# Patient Record
Sex: Female | Born: 1965 | Race: White | Hispanic: Yes | Smoking: Former smoker
Health system: Southern US, Community
[De-identification: ages and names within clinical notes are randomized; demographics above are authoritative.]

## PROBLEM LIST (undated history)

## (undated) DIAGNOSIS — C539 Malignant neoplasm of cervix uteri, unspecified: Secondary | ICD-10-CM

## (undated) DIAGNOSIS — G9050 Complex regional pain syndrome I, unspecified: Secondary | ICD-10-CM

## (undated) DIAGNOSIS — Z87891 Personal history of nicotine dependence: Secondary | ICD-10-CM

## (undated) DIAGNOSIS — M797 Fibromyalgia: Secondary | ICD-10-CM

## (undated) DIAGNOSIS — T18198A Other foreign object in esophagus causing other injury, initial encounter: Secondary | ICD-10-CM

## (undated) DIAGNOSIS — Z9689 Presence of other specified functional implants: Secondary | ICD-10-CM

## (undated) DIAGNOSIS — Z5189 Encounter for other specified aftercare: Secondary | ICD-10-CM

## (undated) DIAGNOSIS — K219 Gastro-esophageal reflux disease without esophagitis: Secondary | ICD-10-CM

## (undated) DIAGNOSIS — F32A Depression, unspecified: Secondary | ICD-10-CM

## (undated) DIAGNOSIS — Z4542 Encounter for adjustment and management of neuropacemaker (brain) (peripheral nerve) (spinal cord): Secondary | ICD-10-CM

## (undated) DIAGNOSIS — I4891 Unspecified atrial fibrillation: Secondary | ICD-10-CM

## (undated) DIAGNOSIS — G8929 Other chronic pain: Secondary | ICD-10-CM

## (undated) DIAGNOSIS — Z862 Personal history of diseases of the blood and blood-forming organs and certain disorders involving the immune mechanism: Secondary | ICD-10-CM

## (undated) DIAGNOSIS — K2270 Barrett's esophagus without dysplasia: Secondary | ICD-10-CM

## (undated) DIAGNOSIS — C801 Malignant (primary) neoplasm, unspecified: Secondary | ICD-10-CM

## (undated) HISTORY — DX: Depression, unspecified: F32.A

## (undated) HISTORY — PX: KNEE ARTHROSCOPY: SHX127

## (undated) HISTORY — PX: SPINAL CORD STIMULATOR IMPLANT: SHX2422

## (undated) HISTORY — PX: ULNAR NERVE TRANSPOSITION: SHX2595

## (undated) HISTORY — PX: CHOLECYSTECTOMY: SHX55

## (undated) HISTORY — PX: GANGLION CYST EXCISION: SHX1691

## (undated) HISTORY — PX: HYSTERECTOMY ABDOMINAL WITH SALPINGO-OOPHORECTOMY: SHX6792

## (undated) HISTORY — DX: Other chronic pain: G89.29

## (undated) HISTORY — PX: HIATAL HERNIA REPAIR: SHX195

## (undated) HISTORY — PX: TONSILLECTOMY: SUR1361

## (undated) HISTORY — DX: Unspecified atrial fibrillation: I48.91

## (undated) HISTORY — DX: Other foreign object in esophagus causing other injury, initial encounter: T18.198A

## (undated) HISTORY — DX: Fibromyalgia: M79.7

## (undated) HISTORY — DX: Complex regional pain syndrome I, unspecified: G90.50

## (undated) HISTORY — PX: BACK SURGERY: SHX140

## (undated) HISTORY — DX: Barrett's esophagus without dysplasia: K22.70

## (undated) HISTORY — PX: TRANSORAL INCISIONLESS FUNDOPLICATION: SHX6840

---

## 2009-11-24 DIAGNOSIS — G9050 Complex regional pain syndrome I, unspecified: Secondary | ICD-10-CM

## 2009-11-24 HISTORY — PX: SPINAL CORD STIMULATOR IMPLANT: SHX2422

## 2009-11-24 HISTORY — DX: Complex regional pain syndrome I, unspecified: G90.50

## 2017-11-24 HISTORY — PX: OTHER SURGICAL HISTORY: SHX169

## 2017-12-16 LAB — CBC AND DIFFERENTIAL
HCT: 43 (ref 36–46)
Hemoglobin: 14.5 (ref 12.0–16.0)
Neutrophils Absolute: 4060
Platelets: 213 (ref 150–399)
WBC: 7

## 2017-12-16 LAB — BASIC METABOLIC PANEL
BUN: 8 (ref 4–21)
CO2: 28 — AB (ref 13–22)
Chloride: 103 (ref 99–108)
Creatinine: 1 (ref 0.5–1.1)
Glucose: 103
Potassium: 4.9 (ref 3.4–5.3)
Sodium: 138 (ref 137–147)

## 2017-12-16 LAB — CBC: RBC: 5.4 — AB (ref 3.87–5.11)

## 2017-12-16 LAB — HEPATIC FUNCTION PANEL
Alkaline Phosphatase: 62 (ref 25–125)
Bilirubin, Total: 0.5

## 2017-12-16 LAB — COMPREHENSIVE METABOLIC PANEL
Albumin: 4.4 (ref 3.5–5.0)
Calcium: 9.7 (ref 8.7–10.7)
GFR calc Af Amer: 77
GFR calc non Af Amer: 67
Globulin: 3.1

## 2017-12-16 LAB — HM MAMMOGRAPHY

## 2017-12-21 DIAGNOSIS — Z79891 Long term (current) use of opiate analgesic: Secondary | ICD-10-CM | POA: Diagnosis not present

## 2017-12-21 DIAGNOSIS — Z5181 Encounter for therapeutic drug level monitoring: Secondary | ICD-10-CM | POA: Diagnosis not present

## 2017-12-21 DIAGNOSIS — M797 Fibromyalgia: Secondary | ICD-10-CM | POA: Diagnosis not present

## 2017-12-21 DIAGNOSIS — M25561 Pain in right knee: Secondary | ICD-10-CM | POA: Diagnosis not present

## 2017-12-21 DIAGNOSIS — M545 Low back pain: Secondary | ICD-10-CM | POA: Diagnosis not present

## 2017-12-21 DIAGNOSIS — M25562 Pain in left knee: Secondary | ICD-10-CM | POA: Diagnosis not present

## 2018-03-16 LAB — VITAMIN D 25 HYDROXY (VIT D DEFICIENCY, FRACTURES): Vit D, 25-Hydroxy: 30

## 2018-06-02 DIAGNOSIS — M797 Fibromyalgia: Secondary | ICD-10-CM | POA: Diagnosis not present

## 2018-06-02 DIAGNOSIS — Z79891 Long term (current) use of opiate analgesic: Secondary | ICD-10-CM | POA: Diagnosis not present

## 2018-06-02 DIAGNOSIS — Z5181 Encounter for therapeutic drug level monitoring: Secondary | ICD-10-CM | POA: Diagnosis not present

## 2018-06-02 DIAGNOSIS — M25561 Pain in right knee: Secondary | ICD-10-CM | POA: Diagnosis not present

## 2018-06-02 DIAGNOSIS — M545 Low back pain: Secondary | ICD-10-CM | POA: Diagnosis not present

## 2018-06-02 DIAGNOSIS — M25562 Pain in left knee: Secondary | ICD-10-CM | POA: Diagnosis not present

## 2018-07-02 DIAGNOSIS — M797 Fibromyalgia: Secondary | ICD-10-CM | POA: Diagnosis not present

## 2018-07-02 DIAGNOSIS — Z79891 Long term (current) use of opiate analgesic: Secondary | ICD-10-CM | POA: Diagnosis not present

## 2018-07-02 DIAGNOSIS — M545 Low back pain: Secondary | ICD-10-CM | POA: Diagnosis not present

## 2018-07-02 DIAGNOSIS — Z5181 Encounter for therapeutic drug level monitoring: Secondary | ICD-10-CM | POA: Diagnosis not present

## 2018-07-02 DIAGNOSIS — M25561 Pain in right knee: Secondary | ICD-10-CM | POA: Diagnosis not present

## 2018-07-02 DIAGNOSIS — M25562 Pain in left knee: Secondary | ICD-10-CM | POA: Diagnosis not present

## 2018-08-02 DIAGNOSIS — M25561 Pain in right knee: Secondary | ICD-10-CM | POA: Diagnosis not present

## 2018-08-02 DIAGNOSIS — Z79891 Long term (current) use of opiate analgesic: Secondary | ICD-10-CM | POA: Diagnosis not present

## 2018-08-02 DIAGNOSIS — M25562 Pain in left knee: Secondary | ICD-10-CM | POA: Diagnosis not present

## 2018-08-02 DIAGNOSIS — Z5181 Encounter for therapeutic drug level monitoring: Secondary | ICD-10-CM | POA: Diagnosis not present

## 2018-08-02 DIAGNOSIS — M797 Fibromyalgia: Secondary | ICD-10-CM | POA: Diagnosis not present

## 2018-08-02 DIAGNOSIS — M545 Low back pain: Secondary | ICD-10-CM | POA: Diagnosis not present

## 2018-08-11 DIAGNOSIS — I4892 Unspecified atrial flutter: Secondary | ICD-10-CM | POA: Diagnosis not present

## 2018-08-11 DIAGNOSIS — I4891 Unspecified atrial fibrillation: Secondary | ICD-10-CM | POA: Diagnosis not present

## 2018-09-01 DIAGNOSIS — M545 Low back pain: Secondary | ICD-10-CM | POA: Diagnosis not present

## 2018-09-01 DIAGNOSIS — M25561 Pain in right knee: Secondary | ICD-10-CM | POA: Diagnosis not present

## 2018-09-01 DIAGNOSIS — M797 Fibromyalgia: Secondary | ICD-10-CM | POA: Diagnosis not present

## 2018-09-01 DIAGNOSIS — M25562 Pain in left knee: Secondary | ICD-10-CM | POA: Diagnosis not present

## 2018-10-01 DIAGNOSIS — M545 Low back pain: Secondary | ICD-10-CM | POA: Diagnosis not present

## 2018-10-01 DIAGNOSIS — M25561 Pain in right knee: Secondary | ICD-10-CM | POA: Diagnosis not present

## 2018-10-01 DIAGNOSIS — Z5181 Encounter for therapeutic drug level monitoring: Secondary | ICD-10-CM | POA: Diagnosis not present

## 2018-10-01 DIAGNOSIS — M25562 Pain in left knee: Secondary | ICD-10-CM | POA: Diagnosis not present

## 2018-10-01 DIAGNOSIS — Z79891 Long term (current) use of opiate analgesic: Secondary | ICD-10-CM | POA: Diagnosis not present

## 2018-10-01 DIAGNOSIS — M797 Fibromyalgia: Secondary | ICD-10-CM | POA: Diagnosis not present

## 2018-10-25 DIAGNOSIS — T85192A Other mechanical complication of implanted electronic neurostimulator (electrode) of spinal cord, initial encounter: Secondary | ICD-10-CM | POA: Diagnosis not present

## 2018-11-01 DIAGNOSIS — Z5181 Encounter for therapeutic drug level monitoring: Secondary | ICD-10-CM | POA: Diagnosis not present

## 2018-11-01 DIAGNOSIS — M797 Fibromyalgia: Secondary | ICD-10-CM | POA: Diagnosis not present

## 2018-11-01 DIAGNOSIS — M545 Low back pain: Secondary | ICD-10-CM | POA: Diagnosis not present

## 2018-11-01 DIAGNOSIS — M25562 Pain in left knee: Secondary | ICD-10-CM | POA: Diagnosis not present

## 2018-11-01 DIAGNOSIS — M25561 Pain in right knee: Secondary | ICD-10-CM | POA: Diagnosis not present

## 2018-11-01 DIAGNOSIS — G8929 Other chronic pain: Secondary | ICD-10-CM | POA: Diagnosis not present

## 2018-11-02 DIAGNOSIS — I480 Paroxysmal atrial fibrillation: Secondary | ICD-10-CM | POA: Diagnosis not present

## 2018-11-02 DIAGNOSIS — Z01810 Encounter for preprocedural cardiovascular examination: Secondary | ICD-10-CM | POA: Diagnosis not present

## 2018-11-04 DIAGNOSIS — Z01818 Encounter for other preprocedural examination: Secondary | ICD-10-CM | POA: Diagnosis not present

## 2018-11-04 DIAGNOSIS — T85192A Other mechanical complication of implanted electronic neurostimulator (electrode) of spinal cord, initial encounter: Secondary | ICD-10-CM | POA: Diagnosis not present

## 2018-11-11 DIAGNOSIS — T85192A Other mechanical complication of implanted electronic neurostimulator (electrode) of spinal cord, initial encounter: Secondary | ICD-10-CM | POA: Insufficient documentation

## 2018-11-11 DIAGNOSIS — Z462 Encounter for fitting and adjustment of other devices related to nervous system and special senses: Secondary | ICD-10-CM | POA: Diagnosis not present

## 2018-11-30 DIAGNOSIS — M25561 Pain in right knee: Secondary | ICD-10-CM | POA: Diagnosis not present

## 2018-11-30 DIAGNOSIS — M797 Fibromyalgia: Secondary | ICD-10-CM | POA: Diagnosis not present

## 2018-11-30 DIAGNOSIS — G8929 Other chronic pain: Secondary | ICD-10-CM | POA: Diagnosis not present

## 2018-11-30 DIAGNOSIS — M25562 Pain in left knee: Secondary | ICD-10-CM | POA: Diagnosis not present

## 2018-11-30 DIAGNOSIS — E669 Obesity, unspecified: Secondary | ICD-10-CM | POA: Diagnosis not present

## 2018-11-30 DIAGNOSIS — M545 Low back pain: Secondary | ICD-10-CM | POA: Diagnosis not present

## 2018-11-30 DIAGNOSIS — Z5181 Encounter for therapeutic drug level monitoring: Secondary | ICD-10-CM | POA: Diagnosis not present

## 2018-12-31 DIAGNOSIS — M545 Low back pain: Secondary | ICD-10-CM | POA: Diagnosis not present

## 2018-12-31 DIAGNOSIS — Z5181 Encounter for therapeutic drug level monitoring: Secondary | ICD-10-CM | POA: Diagnosis not present

## 2018-12-31 DIAGNOSIS — M797 Fibromyalgia: Secondary | ICD-10-CM | POA: Diagnosis not present

## 2018-12-31 DIAGNOSIS — G8929 Other chronic pain: Secondary | ICD-10-CM | POA: Diagnosis not present

## 2018-12-31 DIAGNOSIS — M25561 Pain in right knee: Secondary | ICD-10-CM | POA: Diagnosis not present

## 2018-12-31 DIAGNOSIS — M25562 Pain in left knee: Secondary | ICD-10-CM | POA: Diagnosis not present

## 2019-01-31 DIAGNOSIS — M25561 Pain in right knee: Secondary | ICD-10-CM | POA: Diagnosis not present

## 2019-01-31 DIAGNOSIS — G8929 Other chronic pain: Secondary | ICD-10-CM | POA: Diagnosis not present

## 2019-01-31 DIAGNOSIS — M25562 Pain in left knee: Secondary | ICD-10-CM | POA: Diagnosis not present

## 2019-01-31 DIAGNOSIS — Z79891 Long term (current) use of opiate analgesic: Secondary | ICD-10-CM | POA: Diagnosis not present

## 2019-01-31 DIAGNOSIS — M545 Low back pain: Secondary | ICD-10-CM | POA: Diagnosis not present

## 2019-01-31 DIAGNOSIS — M797 Fibromyalgia: Secondary | ICD-10-CM | POA: Diagnosis not present

## 2019-01-31 DIAGNOSIS — Z5181 Encounter for therapeutic drug level monitoring: Secondary | ICD-10-CM | POA: Diagnosis not present

## 2019-02-01 DIAGNOSIS — G9050 Complex regional pain syndrome I, unspecified: Secondary | ICD-10-CM | POA: Diagnosis not present

## 2019-02-01 DIAGNOSIS — I480 Paroxysmal atrial fibrillation: Secondary | ICD-10-CM | POA: Diagnosis not present

## 2019-03-02 DIAGNOSIS — G8929 Other chronic pain: Secondary | ICD-10-CM | POA: Diagnosis not present

## 2019-03-02 DIAGNOSIS — M545 Low back pain: Secondary | ICD-10-CM | POA: Diagnosis not present

## 2019-03-02 DIAGNOSIS — M797 Fibromyalgia: Secondary | ICD-10-CM | POA: Diagnosis not present

## 2019-03-02 DIAGNOSIS — Z5181 Encounter for therapeutic drug level monitoring: Secondary | ICD-10-CM | POA: Diagnosis not present

## 2019-03-02 DIAGNOSIS — M25561 Pain in right knee: Secondary | ICD-10-CM | POA: Diagnosis not present

## 2019-03-02 DIAGNOSIS — M25562 Pain in left knee: Secondary | ICD-10-CM | POA: Diagnosis not present

## 2019-04-01 DIAGNOSIS — Z5181 Encounter for therapeutic drug level monitoring: Secondary | ICD-10-CM | POA: Diagnosis not present

## 2019-04-01 DIAGNOSIS — M25562 Pain in left knee: Secondary | ICD-10-CM | POA: Diagnosis not present

## 2019-04-01 DIAGNOSIS — M797 Fibromyalgia: Secondary | ICD-10-CM | POA: Diagnosis not present

## 2019-04-01 DIAGNOSIS — G8929 Other chronic pain: Secondary | ICD-10-CM | POA: Diagnosis not present

## 2019-04-01 DIAGNOSIS — M545 Low back pain: Secondary | ICD-10-CM | POA: Diagnosis not present

## 2019-04-01 DIAGNOSIS — M25561 Pain in right knee: Secondary | ICD-10-CM | POA: Diagnosis not present

## 2019-04-26 DIAGNOSIS — Z03818 Encounter for observation for suspected exposure to other biological agents ruled out: Secondary | ICD-10-CM | POA: Diagnosis not present

## 2019-04-26 DIAGNOSIS — B349 Viral infection, unspecified: Secondary | ICD-10-CM | POA: Diagnosis not present

## 2019-04-26 DIAGNOSIS — J029 Acute pharyngitis, unspecified: Secondary | ICD-10-CM | POA: Diagnosis not present

## 2019-05-16 DIAGNOSIS — F438 Other reactions to severe stress: Secondary | ICD-10-CM | POA: Diagnosis not present

## 2019-06-01 DIAGNOSIS — M25561 Pain in right knee: Secondary | ICD-10-CM | POA: Diagnosis not present

## 2019-06-01 DIAGNOSIS — Z5181 Encounter for therapeutic drug level monitoring: Secondary | ICD-10-CM | POA: Diagnosis not present

## 2019-06-01 DIAGNOSIS — G8929 Other chronic pain: Secondary | ICD-10-CM | POA: Diagnosis not present

## 2019-06-01 DIAGNOSIS — M797 Fibromyalgia: Secondary | ICD-10-CM | POA: Diagnosis not present

## 2019-06-01 DIAGNOSIS — M25562 Pain in left knee: Secondary | ICD-10-CM | POA: Diagnosis not present

## 2019-06-01 DIAGNOSIS — M545 Low back pain: Secondary | ICD-10-CM | POA: Diagnosis not present

## 2019-06-14 DIAGNOSIS — F438 Other reactions to severe stress: Secondary | ICD-10-CM | POA: Diagnosis not present

## 2019-06-30 DIAGNOSIS — F438 Other reactions to severe stress: Secondary | ICD-10-CM | POA: Diagnosis not present

## 2019-07-01 DIAGNOSIS — G8929 Other chronic pain: Secondary | ICD-10-CM | POA: Diagnosis not present

## 2019-07-01 DIAGNOSIS — M25562 Pain in left knee: Secondary | ICD-10-CM | POA: Diagnosis not present

## 2019-07-01 DIAGNOSIS — M25561 Pain in right knee: Secondary | ICD-10-CM | POA: Diagnosis not present

## 2019-07-01 DIAGNOSIS — Z5181 Encounter for therapeutic drug level monitoring: Secondary | ICD-10-CM | POA: Diagnosis not present

## 2019-07-01 DIAGNOSIS — M545 Low back pain: Secondary | ICD-10-CM | POA: Diagnosis not present

## 2019-07-01 DIAGNOSIS — M797 Fibromyalgia: Secondary | ICD-10-CM | POA: Diagnosis not present

## 2019-08-03 DIAGNOSIS — M797 Fibromyalgia: Secondary | ICD-10-CM | POA: Diagnosis not present

## 2019-08-03 DIAGNOSIS — M545 Low back pain: Secondary | ICD-10-CM | POA: Diagnosis not present

## 2019-08-03 DIAGNOSIS — G8929 Other chronic pain: Secondary | ICD-10-CM | POA: Diagnosis not present

## 2019-08-03 DIAGNOSIS — M25561 Pain in right knee: Secondary | ICD-10-CM | POA: Diagnosis not present

## 2019-08-03 DIAGNOSIS — Z79891 Long term (current) use of opiate analgesic: Secondary | ICD-10-CM | POA: Diagnosis not present

## 2019-08-03 DIAGNOSIS — Z5181 Encounter for therapeutic drug level monitoring: Secondary | ICD-10-CM | POA: Diagnosis not present

## 2019-08-03 DIAGNOSIS — M25562 Pain in left knee: Secondary | ICD-10-CM | POA: Diagnosis not present

## 2019-08-08 DIAGNOSIS — F438 Other reactions to severe stress: Secondary | ICD-10-CM | POA: Diagnosis not present

## 2019-08-22 ENCOUNTER — Other Ambulatory Visit: Payer: Self-pay

## 2019-08-22 ENCOUNTER — Emergency Department: Payer: Medicaid Other

## 2019-08-22 ENCOUNTER — Emergency Department
Admission: EM | Admit: 2019-08-22 | Discharge: 2019-08-22 | Disposition: A | Discharge status: Home / Self Care | Payer: Medicaid Other | Attending: Emergency Medicine | Admitting: Emergency Medicine

## 2019-08-22 DIAGNOSIS — R0789 Other chest pain: Secondary | ICD-10-CM | POA: Diagnosis not present

## 2019-08-22 DIAGNOSIS — R079 Chest pain, unspecified: Secondary | ICD-10-CM | POA: Diagnosis not present

## 2019-08-22 DIAGNOSIS — Z8541 Personal history of malignant neoplasm of cervix uteri: Secondary | ICD-10-CM | POA: Insufficient documentation

## 2019-08-22 DIAGNOSIS — Z87891 Personal history of nicotine dependence: Secondary | ICD-10-CM | POA: Insufficient documentation

## 2019-08-22 DIAGNOSIS — R000 Tachycardia, unspecified: Secondary | ICD-10-CM | POA: Diagnosis not present

## 2019-08-22 HISTORY — DX: Malignant (primary) neoplasm, unspecified: C80.1

## 2019-08-22 HISTORY — DX: Encounter for other specified aftercare: Z51.89

## 2019-08-22 LAB — BASIC METABOLIC PANEL
Anion gap: 8 (ref 5–15)
BUN: 10 mg/dL (ref 6–20)
CO2: 26 mmol/L (ref 22–32)
Calcium: 8.6 mg/dL — ABNORMAL LOW (ref 8.9–10.3)
Chloride: 103 mmol/L (ref 98–111)
Creatinine, Ser: 0.9 mg/dL (ref 0.44–1.00)
GFR calc Af Amer: >60 mL/min (ref >60)
GFR calc non Af Amer: >60 mL/min (ref >60)
Glucose, Bld: 109 mg/dL — ABNORMAL HIGH (ref 70–99)
Potassium: 4.3 mmol/L (ref 3.5–5.1)
Sodium: 137 mmol/L (ref 135–145)

## 2019-08-22 LAB — CBC WITH DIFFERENTIAL/PLATELET
Abs Immature Granulocytes: 0.03 10*3/uL (ref 0.00–0.07)
Basophils Absolute: 0.1 10*3/uL (ref 0.0–0.1)
Basophils Relative: 1 %
Eosinophils Absolute: 0.1 10*3/uL (ref 0.0–0.5)
Eosinophils Relative: 1 %
HCT: 39.5 % (ref 36.0–46.0)
Hemoglobin: 12.6 g/dL (ref 12.0–15.0)
Immature Granulocytes: 0 %
Lymphocytes Relative: 23 %
Lymphs Abs: 2.1 10*3/uL (ref 0.7–4.0)
MCH: 26.8 pg (ref 26.0–34.0)
MCHC: 31.9 g/dL (ref 30.0–36.0)
MCV: 83.9 fL (ref 80.0–100.0)
Monocytes Absolute: 0.6 10*3/uL (ref 0.1–1.0)
Monocytes Relative: 7 %
Neutro Abs: 6 10*3/uL (ref 1.7–7.7)
Neutrophils Relative %: 68 %
Platelets: 156 10*3/uL (ref 150–400)
RBC: 4.71 MIL/uL (ref 3.87–5.11)
RDW: 13.5 % (ref 11.5–15.5)
WBC: 8.8 10*3/uL (ref 4.0–10.5)
nRBC: 0 % (ref 0.0–0.2)

## 2019-08-22 LAB — TROPONIN I (HIGH SENSITIVITY)
Troponin I (High Sensitivity): <2 ng/L (ref <18)
Troponin I (High Sensitivity): <2 ng/L (ref <18)

## 2019-08-22 MED ORDER — MORPHINE SULFATE (PF) 4 MG/ML IV SOLN
4.0000 mg | Freq: Once | INTRAVENOUS | Status: AC
  Administered 2019-08-22: 02:00:00 4 mg via INTRAVENOUS
  Filled 2019-08-22: qty 1

## 2019-08-22 NOTE — ED Triage Notes (Signed)
Patient arrived by [facility] from home with complaints of chest pain that started this evening. Per patient states feels like contractions in middle of chest. BP WDL. Patient ST at 115. EMS gave 324 mg Asprin, I nitro spray.

## 2019-08-22 NOTE — ED Provider Notes (Signed)
[facility] Emergency Department Provider Note   ____________________________________________   First MD Initiated Contact with Patient 08/22/19 0206     (approximate)  I have reviewed the triage vital signs and the nursing notes.   HISTORY  Chief Complaint Chest Pain   HPI Julie Berger is a 53 y.o. female with past medical history of fibromyalgia and atrial fibrillation presents to the ED complaining of chest pain.  Patient reports that she has been dealing with a couple weeks of intermittent pain in the center of her chest.  She describes it as feeling like a squeezing, which can come on at any time.  It is not exacerbated by exertion or a deep breath and became particularly severe while she was sitting still at 8 PM this evening.  She states it is come in waves since then, was mild upon arrival to the ED but ramped up during course of interview.  She denies any associated fevers, cough, shortness of breath, nausea, vomiting, or abdominal pain.  She states she has never had similar symptoms in the past, denies any cardiac history beyond atrial fibrillation.  She states she is not on anticoagulation for her atrial fibrillation because her cardiologist told her "it was due to stress".        Past Medical History:  Diagnosis Date  . Blood transfusion without reported diagnosis   . Cancer ([place])    cervical    There are no active problems to display for this patient.   Past Surgical History:  Procedure Laterality Date  . ABDOMINAL HYSTERECTOMY    . BACK SURGERY     x 3  . CHOLECYSTECTOMY    . HERNIA REPAIR    . KNEE ARTHROSCOPY    . TIF    . TONSILLECTOMY      Prior to Admission medications   Not on File    Allergies Nsaids  Family History  Problem Relation Age of Onset  . Heart attack Father   . Mitral valve prolapse Father   . Hypertension Father     Social History Social History   Tobacco Use  . Smoking status: Former Smoker     Packs/day: 1.00    Years: 20.00    Pack years: 20.00    Types: Cigarettes    Quit date: 08/22/1999    Years since quitting: 20.0  . Smokeless tobacco: Never Used  Substance Use Topics  . Alcohol use: Never    Frequency: Never  . Drug use: Never    Review of Systems  Constitutional: No fever/chills Eyes: No visual changes. ENT: No sore throat. Cardiovascular: Positive for chest pain. Respiratory: Denies shortness of breath. Gastrointestinal: No abdominal pain.  No nausea, no vomiting.  No diarrhea.  No constipation. Genitourinary: Negative for dysuria. Musculoskeletal: Negative for back pain. Skin: Negative for rash. Neurological: Negative for headaches, focal weakness or numbness.  ____________________________________________   PHYSICAL EXAM:  VITAL SIGNS: ED Triage Vitals  Enc Vitals Group     BP      Pulse      Resp      Temp      Temp src      SpO2      Weight      Height      Head Circumference      Peak Flow      Pain Score      Pain Loc      Pain Edu?      Excl.  in [place]?     Constitutional: Alert and oriented. Eyes: Conjunctivae are normal. Head: Atraumatic. Nose: No congestion/rhinnorhea. Mouth/Throat: Mucous membranes are moist. Neck: Normal ROM Cardiovascular: Normal rate, regular rhythm. Grossly normal heart sounds. Respiratory: Normal respiratory effort.  No retractions. Lungs CTAB. Gastrointestinal: Soft and nontender. No distention. Genitourinary: deferred Musculoskeletal: No lower extremity tenderness nor edema. Neurologic:  Normal speech and language. No gross focal neurologic deficits are appreciated. Skin:  Skin is warm, dry and intact. No rash noted. Psychiatric: Mood and affect are normal. Speech and behavior are normal.  ____________________________________________   LABS (all labs ordered are listed, but only abnormal results are displayed)  Labs Reviewed  BASIC METABOLIC PANEL - Abnormal; Notable for the following  components:      Result Value   Glucose, Bld 109 (*)    Calcium 8.6 (*)    All other components within normal limits  CBC WITH DIFFERENTIAL/PLATELET  TROPONIN I (HIGH SENSITIVITY)  TROPONIN I (HIGH SENSITIVITY)   ____________________________________________  EKG  ED ECG REPORT I, Blake Divine, the attending physician, personally viewed and interpreted this ECG.   Date: 08/22/2019  EKG Time: 2:09  Rate: 100  Rhythm: sinus tachycardia  Axis: Normal  Intervals:none  ST&T Change: None    PROCEDURES  Procedure(s) performed (including Critical Care):  Procedures   ____________________________________________   INITIAL IMPRESSION / ASSESSMENT AND PLAN / ED COURSE       53 year old female presents to the ED with intermittent pain in her chest over the past 2 weeks which is not associated with exertion.  EKG shows sinus rhythm without any ischemic changes, low suspicion for ACS given atypical symptoms.  Will check 2 sets of troponin, CBC, BMP, chest x-ray.  Musculoskeletal etiology of pain appears more likely.  Do not suspect PE as she is low risk by [place], no signs or symptoms of DVT.  2 sets of troponin within normal limits.  Patient reports chest pain is now resolved.  Chest x-ray negative for acute process.  Do not suspect ACS given heart score of less than 4 in 2 sets of negative troponin.  Patient appropriate for discharge home with PCP follow-up, patient agrees with plan.  Counseled her to return to the ED for new or worsening symptoms.      ____________________________________________   FINAL CLINICAL IMPRESSION(S) / ED DIAGNOSES  Final diagnoses:  Nonspecific chest pain     ED Discharge Orders    None       Note:  This document was prepared using Dragon voice recognition software and may include unintentional dictation errors.   Blake Divine, MD 08/22/19 [phone]

## 2019-08-22 NOTE — ED Notes (Addendum)
Wrong chart

## 2019-09-02 DIAGNOSIS — Z5181 Encounter for therapeutic drug level monitoring: Secondary | ICD-10-CM | POA: Diagnosis not present

## 2019-09-02 DIAGNOSIS — G8929 Other chronic pain: Secondary | ICD-10-CM | POA: Diagnosis not present

## 2019-09-02 DIAGNOSIS — M797 Fibromyalgia: Secondary | ICD-10-CM | POA: Diagnosis not present

## 2019-09-02 DIAGNOSIS — M25561 Pain in right knee: Secondary | ICD-10-CM | POA: Diagnosis not present

## 2019-09-02 DIAGNOSIS — M545 Low back pain: Secondary | ICD-10-CM | POA: Diagnosis not present

## 2019-09-02 DIAGNOSIS — M25562 Pain in left knee: Secondary | ICD-10-CM | POA: Diagnosis not present

## 2019-09-08 DIAGNOSIS — F438 Other reactions to severe stress: Secondary | ICD-10-CM | POA: Diagnosis not present

## 2019-09-21 DIAGNOSIS — F438 Other reactions to severe stress: Secondary | ICD-10-CM | POA: Diagnosis not present

## 2019-09-28 DIAGNOSIS — G8929 Other chronic pain: Secondary | ICD-10-CM | POA: Diagnosis not present

## 2019-09-28 DIAGNOSIS — M25562 Pain in left knee: Secondary | ICD-10-CM | POA: Diagnosis not present

## 2019-09-28 DIAGNOSIS — Z5181 Encounter for therapeutic drug level monitoring: Secondary | ICD-10-CM | POA: Diagnosis not present

## 2019-09-28 DIAGNOSIS — M797 Fibromyalgia: Secondary | ICD-10-CM | POA: Diagnosis not present

## 2019-09-28 DIAGNOSIS — M25561 Pain in right knee: Secondary | ICD-10-CM | POA: Diagnosis not present

## 2019-09-28 DIAGNOSIS — M545 Low back pain: Secondary | ICD-10-CM | POA: Diagnosis not present

## 2019-09-28 DIAGNOSIS — F438 Other reactions to severe stress: Secondary | ICD-10-CM | POA: Diagnosis not present

## 2019-10-12 DIAGNOSIS — F438 Other reactions to severe stress: Secondary | ICD-10-CM | POA: Diagnosis not present

## 2019-10-26 ENCOUNTER — Ambulatory Visit: Payer: Medicaid Other | Admitting: Adult Health

## 2019-10-26 DIAGNOSIS — F438 Other reactions to severe stress: Secondary | ICD-10-CM | POA: Diagnosis not present

## 2019-11-02 ENCOUNTER — Ambulatory Visit: Payer: Medicaid Other | Admitting: Family Medicine

## 2019-11-02 ENCOUNTER — Other Ambulatory Visit: Payer: Self-pay

## 2019-11-02 ENCOUNTER — Encounter: Payer: Self-pay | Admitting: Family Medicine

## 2019-11-02 VITALS — BP 115/80 | HR 88 | Temp 97.3°F | Wt 186.0 lb

## 2019-11-02 DIAGNOSIS — M25561 Pain in right knee: Secondary | ICD-10-CM | POA: Diagnosis not present

## 2019-11-02 DIAGNOSIS — E66811 Obesity, class 1: Secondary | ICD-10-CM

## 2019-11-02 DIAGNOSIS — F329 Major depressive disorder, single episode, unspecified: Secondary | ICD-10-CM | POA: Insufficient documentation

## 2019-11-02 DIAGNOSIS — F3341 Major depressive disorder, recurrent, in partial remission: Secondary | ICD-10-CM

## 2019-11-02 DIAGNOSIS — Z1211 Encounter for screening for malignant neoplasm of colon: Secondary | ICD-10-CM

## 2019-11-02 DIAGNOSIS — M545 Low back pain: Secondary | ICD-10-CM | POA: Diagnosis not present

## 2019-11-02 DIAGNOSIS — G9050 Complex regional pain syndrome I, unspecified: Secondary | ICD-10-CM | POA: Diagnosis not present

## 2019-11-02 DIAGNOSIS — G894 Chronic pain syndrome: Secondary | ICD-10-CM

## 2019-11-02 DIAGNOSIS — Z1231 Encounter for screening mammogram for malignant neoplasm of breast: Secondary | ICD-10-CM

## 2019-11-02 DIAGNOSIS — Z8541 Personal history of malignant neoplasm of cervix uteri: Secondary | ICD-10-CM

## 2019-11-02 DIAGNOSIS — M25562 Pain in left knee: Secondary | ICD-10-CM | POA: Diagnosis not present

## 2019-11-02 DIAGNOSIS — E669 Obesity, unspecified: Secondary | ICD-10-CM

## 2019-11-02 DIAGNOSIS — M797 Fibromyalgia: Secondary | ICD-10-CM | POA: Diagnosis not present

## 2019-11-02 DIAGNOSIS — Z79891 Long term (current) use of opiate analgesic: Secondary | ICD-10-CM | POA: Diagnosis not present

## 2019-11-02 DIAGNOSIS — Z560 Unemployment, unspecified: Secondary | ICD-10-CM

## 2019-11-02 DIAGNOSIS — Z6830 Body mass index (BMI) 30.0-30.9, adult: Secondary | ICD-10-CM

## 2019-11-02 DIAGNOSIS — Z5181 Encounter for therapeutic drug level monitoring: Secondary | ICD-10-CM | POA: Diagnosis not present

## 2019-11-02 DIAGNOSIS — G8929 Other chronic pain: Secondary | ICD-10-CM | POA: Diagnosis not present

## 2019-11-02 DIAGNOSIS — I480 Paroxysmal atrial fibrillation: Secondary | ICD-10-CM | POA: Diagnosis not present

## 2019-11-02 DIAGNOSIS — K2100 Gastro-esophageal reflux disease with esophagitis, without bleeding: Secondary | ICD-10-CM | POA: Diagnosis not present

## 2019-11-02 NOTE — Assessment & Plan Note (Signed)
Discussed importance of healthy weight management Discussed diet and exercise  

## 2019-11-02 NOTE — Assessment & Plan Note (Signed)
As above, followed by pain management Continue Lyrica

## 2019-11-02 NOTE — Assessment & Plan Note (Addendum)
Chronic pain related to multiple back surgeries, fibromyalgia and CRPS SCS in place Followed by pain management - will refer to local pain management No changes to medications

## 2019-11-02 NOTE — Assessment & Plan Note (Signed)
SCS in place Followed by pain management - will refer to local pain management

## 2019-11-02 NOTE — Assessment & Plan Note (Signed)
Currently in NSR Followed by Cardiology Not on any anticoagulation Continue Diltiazem for rate control

## 2019-11-02 NOTE — Progress Notes (Signed)
Patient: Julie Berger, Female    DOB: 1966/06/03, 53 y.o.   MRN: [id number] Visit Date: 11/02/2019  Today's Provider: Lavon Paganini, MD   Chief Complaint  Patient presents with  . Establish Care   Subjective:     Establish Care Julie Berger is a 53 y.o. female who presents today to establish care. She feels well. She reports exercising some. She reports she is sleeping fairly well.  She moved here about 1.5 yrs ago, but was having surgery and kept all of her medical appointments in [place] for the time being. -----------------------------------------------------------------  Needs referral to local pain management.  Her current pain management is in [place].  Has a spinal cord stimulator and recently had it replaced in 2019 by [facility].  She has chronic back pain s/p 3 back surgeries.  She also suffers from fibromyalgia and Complex Regional Pain Syndrome. Takes Percocet 3-4 times daily.  Lyrica 168m TID.  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  She reports this is related to her fibromyalgia. She suffered from GERD (?[place]) and is s/p TIF and Nissen. She is not currently on any antacids or PPIs.  She would like referral to GI.  She has never had colon cancer screening and declines colonoscopy.  She will agree to [employer]  She cannot remember when she last had a mammogram and agrees to one being ordered.  She has a lArts administrator(Dr FUbaldo Glassing for her pAfib. She is taking Cardizem for rate control.  No anticoagulation.  [facility] (psychiatrist).  Taking Wellbutrin XL 1553mdaily (filled by Psychiatry). Reports good control of her depression symptoms  History of cervical cancer s/p hysterectomy in 2002.  She reports she was cleared after a few vaginal pap smears were negative.  Review of Systems  Constitutional: Positive for fatigue. Negative for activity change, appetite change, chills, diaphoresis, fever and  unexpected weight change.  HENT: Positive for dental problem and voice change. Negative for congestion, drooling, ear discharge, ear pain, facial swelling, hearing loss, mouth sores, nosebleeds, postnasal drip, rhinorrhea, sinus pressure, sinus pain, sneezing, sore throat, tinnitus and trouble swallowing.   Eyes: Positive for photophobia and itching. Negative for pain, discharge, redness and visual disturbance.  Respiratory: Negative.   Cardiovascular: Positive for chest pain, palpitations and leg swelling.  Gastrointestinal: Positive for constipation and diarrhea. Negative for abdominal distention, abdominal pain, anal bleeding, blood in stool, nausea, rectal pain and vomiting.  Endocrine: Negative.   Genitourinary: Negative.   Musculoskeletal: Positive for arthralgias, back pain, joint swelling, myalgias, neck pain and neck stiffness. Negative for gait problem.  Skin: Negative.   Allergic/Immunologic: Negative.   Neurological: Negative.   Hematological: Negative.   Psychiatric/Behavioral: Negative.     Social History      She  reports that she quit smoking about 20 years ago. Her smoking use included cigarettes. She has a 20.00 pack-year smoking history. She has never used smokeless tobacco. She reports that she does not drink alcohol or use drugs.       Social History   Socioeconomic History  . Marital status: Divorced    Spouse name: Not on file  . Number of children: 6  . Years of education: Not on file  . Highest education level: Not on file  Occupational History  . Occupation: Disabiled  Social Needs  . Financial resource strain: Not on file  . Food insecurity    Worry: Not on file    Inability:  Not on file  . Transportation needs    Medical: Not on file    Non-medical: Not on file  Tobacco Use  . Smoking status: Former Smoker    Packs/day: 1.00    Years: 20.00    Pack years: 20.00    Types: Cigarettes    Quit date: 08/22/1999    Years since quitting: 20.2  .  Smokeless tobacco: Never Used  Substance and Sexual Activity  . Alcohol use: Never    Frequency: Never  . Drug use: Never  . Sexual activity: Never  Lifestyle  . Physical activity    Days per week: Not on file    Minutes per session: Not on file  . Stress: Not on file  Relationships  . Social Herbalist on phone: Not on file    Gets together: Not on file    Attends religious service: Not on file    Active member of club or organization: Not on file    Attends meetings of clubs or organizations: Not on file    Relationship status: Not on file  Other Topics Concern  . Not on file  Social History Narrative  . Not on file    Past Medical History:  Diagnosis Date  . Blood transfusion without reported diagnosis   . Cancer ([facility])    cervical  . Chronic pain   . 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 Chronic pain 11/02/2019  . History of cervical cancer 11/02/2019  . Fibromyalgia 11/02/2019  . Obesity 11/02/2019  . Not currently working due to disabled status 11/02/2019  . MDD (major depressive disorder) 11/02/2019  . Complex regional pain syndrome I 11/02/2019  . Paroxysmal atrial fibrillation ([place]) 11/02/2019    Past Surgical History:  Procedure Laterality Date  . ABDOMINAL HYSTERECTOMY    . BACK SURGERY     x 3  . CHOLECYSTECTOMY    . HIATAL HERNIA REPAIR    . KNEE ARTHROSCOPY Left   . TONSILLECTOMY    . TRANSORAL INCISIONLESS FUNDOPLICATION    . ULNAR NERVE TRANSPOSITION Left     Family History        Family Status  Relation Name Status  . Father  Alive  . Mother  Alive  . Brother  (Not Specified)  . Neg Hx  (Not Specified)        Her family history includes Cancer in her brother; Dementia in her mother; Diabetes in her brother; Fibromyalgia in her mother; Heart attack in her father; Hypertension in her father; Lupus in her mother; Mitral valve prolapse in  her father. There is no history of Colon cancer or Breast cancer.      Allergies  Allergen Reactions  . Nsaids Shortness Of Breath     Current Outpatient Medications:  .  buPROPion (WELLBUTRIN XL) 150 MG 24 hr tablet, Take 150 mg by mouth daily., Disp: , Rfl:  .  diltiazem (CARDIZEM CD) 120 MG 24 hr capsule, Take by mouth., Disp: , Rfl:  .  naloxone (NARCAN) 4 MG/0.1ML LIQD nasal spray kit, USE UTD FOR SUSPECTED OVERDOSE., Disp: , Rfl:  .  oxyCODONE-acetaminophen (PERCOCET) 10-325 MG tablet, Take 1 tablet by mouth 4 (four) times daily as needed., Disp: , Rfl:  .  pregabalin (LYRICA) 150 MG capsule, Take 150 mg by mouth 3 (three) times daily., Disp: , Rfl:    Patient  Care Team: Virginia Crews, MD as PCP - General (Family Medicine)    Objective:    Vitals: BP 115/80 (BP Location: Right Arm, Patient Position: Sitting, Cuff Size: Large)   Pulse 88   Temp (!) 97.3 F (36.3 C) (Temporal)   Wt 186 lb (84.4 kg)   BMI 30.95 kg/m    Vitals:   11/02/19 0821  BP: 115/80  Pulse: 88  Temp: (!) 97.3 F (36.3 C)  TempSrc: Temporal  Weight: 186 lb (84.4 kg)     Physical Exam Vitals reviewed.  Constitutional:      General: She is not in acute distress.    Appearance: Normal appearance. She is well-developed. She is not diaphoretic.  HENT:     Head: Normocephalic and atraumatic.     Right Ear: Tympanic membrane, ear canal and external ear normal.     Left Ear: Tympanic membrane, ear canal and external ear normal.  Eyes:     General: No scleral icterus.    Conjunctiva/sclera: Conjunctivae normal.     Pupils: Pupils are equal, round, and reactive to light.  Neck:     Thyroid: No thyromegaly.  Cardiovascular:     Rate and Rhythm: Normal rate and regular rhythm.     Pulses: Normal pulses.     Heart sounds: Normal heart sounds. No murmur.  Pulmonary:     Effort: Pulmonary effort is normal. No respiratory distress.     Breath sounds: Normal breath sounds. No wheezing or  rales.  Abdominal:     General: There is no distension.     Palpations: Abdomen is soft.     Tenderness: There is no abdominal tenderness.  Musculoskeletal:        General: No deformity.     Cervical back: Neck supple.     Right lower leg: No edema.     Left lower leg: No edema.  Lymphadenopathy:     Cervical: No cervical adenopathy.  Skin:    General: Skin is warm and dry.     Capillary Refill: Capillary refill takes less than 2 seconds.     Findings: No rash.  Neurological:     Mental Status: She is alert and oriented to person, place, and time. Mental status is at baseline.  Psychiatric:        Mood and Affect: Mood normal.        Behavior: Behavior normal.        Thought Content: Thought content normal.      Depression Screen PHQ 2/9 Scores 11/02/2019  PHQ - 2 Score 3  PHQ- 9 Score 12       Assessment & Plan:     Establish care  Exercise Activities and Dietary recommendations Goals   None      There is no immunization history on file for this patient.  Health Maintenance  Topic Date Due  . Hepatitis C Screening  [birth year]  . HIV Screening  07/11/1979  . TETANUS/TDAP  07/11/1983  . MAMMOGRAM  07/10/2014  . COLONOSCOPY  07/10/2014  . INFLUENZA VACCINE  02/22/2020 (Originally 06/25/2019)     Discussed health benefits of physical activity, and encouraged her to engage in regular exercise appropriate for her age and condition.    --------------------------------------------------------------------  Problem List Items Addressed This Visit      Cardiovascular and Mediastinum   Paroxysmal atrial fibrillation ([place])    Currently in NSR Followed by Cardiology Not on any anticoagulation Continue Diltiazem for rate control  Relevant Medications   diltiazem (CARDIZEM CD) 120 MG 24 hr capsule     Digestive   Gastroesophageal reflux disease with esophagitis    Chronic and currently well controlled without any medications Surgery of possible  Barrett's esophagus and is status post TIF and Nissen Referral to GI per patient request      Relevant Orders   Ambulatory referral to Gastroenterology     Nervous and Auditory   Complex regional pain syndrome I    SCS in place Followed by pain management - will refer to local pain management      Relevant Medications   pregabalin (LYRICA) 150 MG capsule   oxyCODONE-acetaminophen (PERCOCET) 10-325 MG tablet   buPROPion (WELLBUTRIN XL) 150 MG 24 hr tablet     Other   Chronic pain - Primary    Chronic pain related to multiple back surgeries, fibromyalgia and CRPS SCS in place Followed by pain management - will refer to local pain management No changes to medications       Relevant Medications   pregabalin (LYRICA) 150 MG capsule   oxyCODONE-acetaminophen (PERCOCET) 10-325 MG tablet   buPROPion (WELLBUTRIN XL) 150 MG 24 hr tablet   Other Relevant Orders   Ambulatory referral to Pain Clinic   History of cervical cancer    S.p hysterectomy      Fibromyalgia    As above, followed by pain management Continue Lyrica      Relevant Medications   pregabalin (LYRICA) 150 MG capsule   oxyCODONE-acetaminophen (PERCOCET) 10-325 MG tablet   buPROPion (WELLBUTRIN XL) 150 MG 24 hr tablet   Other Relevant Orders   Ambulatory referral to Pain Clinic   Obesity    Discussed importance of healthy weight management Discussed diet and exercise       Not currently working due to disabled status    Patient on permanent disability related to her chronic pain syndrome      MDD (major depressive disorder)    Followed by psychiatry Well controlled Continue Wellbutrin Seeing a therapist      Relevant Medications   buPROPion (WELLBUTRIN XL) 150 MG 24 hr tablet    Other Visit Diagnoses    Encounter for screening mammogram for malignant neoplasm of breast       Relevant Orders   MM 3D SCREEN BREAST BILATERAL   Screen for colon cancer       Relevant Orders   Cologuard        Return in about 3 months (around 01/31/2020) for CPE.   Approximately 45 minutes was spent in discussion of which greater than 50% was consultation.     The entirety of the information documented in the History of Present Illness, Review of Systems and Physical Exam were personally obtained by me. Portions of this information were initially documented by Ashley Royalty, CMA and reviewed by me for thoroughness and accuracy.    Kaitrin Seybold, Dionne Bucy, MD MPH [facility]

## 2019-11-02 NOTE — Assessment & Plan Note (Signed)
Patient on permanent disability related to her chronic pain syndrome

## 2019-11-02 NOTE — Assessment & Plan Note (Signed)
Followed by psychiatry Well controlled Continue Wellbutrin Seeing a therapist

## 2019-11-02 NOTE — Assessment & Plan Note (Signed)
S/p hysterectomy.  

## 2019-11-03 DIAGNOSIS — K2100 Gastro-esophageal reflux disease with esophagitis, without bleeding: Secondary | ICD-10-CM | POA: Insufficient documentation

## 2019-11-03 NOTE — Assessment & Plan Note (Signed)
Chronic and currently well controlled without any medications Surgery of possible Barrett's esophagus and is status post TIF and Nissen Referral to GI per patient request

## 2019-11-09 DIAGNOSIS — F438 Other reactions to severe stress: Secondary | ICD-10-CM | POA: Diagnosis not present

## 2019-11-11 DIAGNOSIS — Z23 Encounter for immunization: Secondary | ICD-10-CM | POA: Diagnosis not present

## 2019-11-30 DIAGNOSIS — F438 Other reactions to severe stress: Secondary | ICD-10-CM | POA: Diagnosis not present

## 2019-12-01 DIAGNOSIS — F438 Other reactions to severe stress: Secondary | ICD-10-CM | POA: Diagnosis not present

## 2019-12-02 DIAGNOSIS — M25562 Pain in left knee: Secondary | ICD-10-CM | POA: Diagnosis not present

## 2019-12-02 DIAGNOSIS — G8929 Other chronic pain: Secondary | ICD-10-CM | POA: Diagnosis not present

## 2019-12-02 DIAGNOSIS — M25561 Pain in right knee: Secondary | ICD-10-CM | POA: Diagnosis not present

## 2019-12-02 DIAGNOSIS — Z5181 Encounter for therapeutic drug level monitoring: Secondary | ICD-10-CM | POA: Diagnosis not present

## 2019-12-02 DIAGNOSIS — M545 Low back pain: Secondary | ICD-10-CM | POA: Diagnosis not present

## 2019-12-02 DIAGNOSIS — M797 Fibromyalgia: Secondary | ICD-10-CM | POA: Diagnosis not present

## 2019-12-06 ENCOUNTER — Encounter: Payer: Self-pay | Admitting: Family Medicine

## 2019-12-27 ENCOUNTER — Telehealth: Payer: Self-pay | Admitting: *Deleted

## 2019-12-27 ENCOUNTER — Encounter: Payer: Self-pay | Admitting: *Deleted

## 2019-12-29 DIAGNOSIS — Z1211 Encounter for screening for malignant neoplasm of colon: Secondary | ICD-10-CM | POA: Diagnosis not present

## 2019-12-29 LAB — COLOGUARD: Cologuard: NEGATIVE

## 2020-01-02 DIAGNOSIS — G8929 Other chronic pain: Secondary | ICD-10-CM | POA: Diagnosis not present

## 2020-01-02 DIAGNOSIS — M797 Fibromyalgia: Secondary | ICD-10-CM | POA: Diagnosis not present

## 2020-01-02 DIAGNOSIS — M25561 Pain in right knee: Secondary | ICD-10-CM | POA: Diagnosis not present

## 2020-01-02 DIAGNOSIS — Z5181 Encounter for therapeutic drug level monitoring: Secondary | ICD-10-CM | POA: Diagnosis not present

## 2020-01-02 DIAGNOSIS — M47816 Spondylosis without myelopathy or radiculopathy, lumbar region: Secondary | ICD-10-CM | POA: Diagnosis not present

## 2020-01-02 DIAGNOSIS — M25562 Pain in left knee: Secondary | ICD-10-CM | POA: Diagnosis not present

## 2020-01-03 LAB — COLOGUARD: COLOGUARD: NEGATIVE

## 2020-01-04 DIAGNOSIS — F438 Other reactions to severe stress: Secondary | ICD-10-CM | POA: Diagnosis not present

## 2020-01-05 ENCOUNTER — Encounter: Payer: Self-pay | Admitting: Family Medicine

## 2020-01-10 ENCOUNTER — Encounter: Payer: Self-pay | Admitting: Gastroenterology

## 2020-01-10 ENCOUNTER — Other Ambulatory Visit: Payer: Self-pay

## 2020-01-10 ENCOUNTER — Ambulatory Visit: Payer: Medicaid Other | Admitting: Gastroenterology

## 2020-01-10 VITALS — BP 117/79 | HR 83 | Temp 98.3°F | Wt 187.2 lb

## 2020-01-10 DIAGNOSIS — K2100 Gastro-esophageal reflux disease with esophagitis, without bleeding: Secondary | ICD-10-CM | POA: Diagnosis not present

## 2020-01-10 DIAGNOSIS — R0789 Other chest pain: Secondary | ICD-10-CM | POA: Diagnosis not present

## 2020-01-10 NOTE — Progress Notes (Signed)
Gastroenterology Consultation  Referring Provider:     Virginia Crews, MD Primary Care Physician:  Virginia Crews, MD Primary Gastroenterologist:  Dr. Allen Norris     Reason for Consultation:     Chest pain        HPI:   Julie Berger is a 54 y.o. y/o female referred for consultation & management of chest pain. by Dr. Brita Romp, Dionne Bucy, MD.  This patient comes in today with a history of reflux.  The patient had been followed in the past in [place] but had moved here approximately 2 years ago.  She states that she was diagnosed with Barrett's esophagus and had ablation treatment with multiple sessions until the Barrett's was completely eradicated.  She also states that this was done by a surgeon who also then performed a antireflux procedure on her.  She now reports that she is having some symptoms of acid reflux but is not on any medication for acid reflux.  She denies any dysphagia nausea vomiting fevers or chills.  The patient states that she is up-to-date with her colon cancer screening because she had a negative Cologuard sent off.  Despite the patient's recent GI symptoms she also states that she is trying to establish care with a local gastroenterologist.  She denies any black stools or bloody stools.  She also denies any change in bowel habits.  Patient had a chest x-ray in September 2020 that showed low lung volumes with postoperative changes in the upper abdomen and lower thoracic spine stimulating device.  Past Medical History:  Diagnosis Date  . Atrial fibrillation ([place])   . Barrett esophagus   . Blood transfusion without reported diagnosis   . Cancer (HCC)    cervical  . Chronic pain   . Complex regional pain syndrome I   . 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 BACK SURGERY     x 3  . CHOLECYSTECTOMY    . HIATAL HERNIA REPAIR    . HYSTERECTOMY ABDOMINAL WITH  SALPINGO-OOPHORECTOMY    . KNEE ARTHROSCOPY Left   . SPINAL CORD STIMULATOR IMPLANT    . TONSILLECTOMY    . TRANSORAL INCISIONLESS FUNDOPLICATION    . ULNAR NERVE TRANSPOSITION Left     Prior to Admission medications   Medication Sig Start Date End Date Taking? Authorizing Provider  buPROPion (WELLBUTRIN XL) 150 MG 24 hr tablet Take 150 mg by mouth daily.    [provider]  diltiazem (CARDIZEM CD) 120 MG 24 hr capsule Take by mouth. 02/01/19 02/01/20  [provider]  naloxone Karma Greaser) 4 MG/0.1ML LIQD nasal spray kit USE UTD FOR SUSPECTED OVERDOSE. 12/21/17   [provider]  oxyCODONE-acetaminophen (PERCOCET) 10-325 MG tablet Take 1 tablet by mouth 4 (four) times daily as needed. 10/01/18   [provider]  pregabalin (LYRICA) 150 MG capsule Take 150 mg by mouth 3 (three) times daily. 10/10/18   [provider]    Family History  Problem Relation Age of Onset  . Heart attack Father   . Mitral valve prolapse Father   . Hypertension Father   . Dementia Mother   . Lupus Mother   . Fibromyalgia Mother   . Diabetes Brother   . Cancer Brother        unknown type  . Colon cancer Neg Hx   . Breast cancer Neg Hx      Social  History   Tobacco Use  . Smoking status: Former Smoker    Packs/day: 1.00    Years: 20.00    Pack years: 20.00    Types: Cigarettes    Quit date: 08/22/1999    Years since quitting: 20.4  . Smokeless tobacco: Never Used  Substance Use Topics  . Alcohol use: Never  . Drug use: Never    Allergies as of 01/10/2020 - Review Complete 11/02/2019  Allergen Reaction Noted  . Nsaids Shortness Of Breath 08/22/2019    Review of Systems:    All systems reviewed and negative except where noted in HPI.   Physical Exam:  There were no vitals taken for this visit. No LMP recorded. Patient has had a hysterectomy. General:   Alert,  Well-developed, well-nourished, pleasant and cooperative in NAD Head:  Normocephalic and  atraumatic. Eyes:  Sclera clear, no icterus.   Conjunctiva pink. Ears:  Normal auditory acuity. Neck:  Supple; no masses or thyromegaly. Lungs:  Respirations even and unlabored.  Clear throughout to auscultation.   No wheezes, crackles, or rhonchi. No acute distress. Heart:  Regular rate and rhythm; no murmurs, clicks, rubs, or gallops. Abdomen:  Normal bowel sounds.  No bruits.  Soft, non-tender and non-distended without masses, hepatosplenomegaly or hernias noted.  No guarding or rebound tenderness.  Negative Carnett sign.   Rectal:  Deferred.  Pulses:  Normal pulses noted. Extremities:  No clubbing or edema.  No cyanosis. Neurologic:  Alert and oriented x3;  grossly normal neurologically. Skin:  Intact without significant lesions or rashes.  No jaundice. Lymph Nodes:  No significant cervical adenopathy. Psych:  Alert and cooperative. Normal mood and affect.  Imaging Studies: No results found.  Assessment and Plan:   Julie Berger is a 54 y.o. y/o female who comes in today with a history of Barrett's esophagus for which she states she had ablation of this.  She also reports that she had antireflux surgery.  There is no report of any maintenance therapy for reflux and states that she has been doing well until recently and now she is starting to have some symptoms of reflux.  The patient was in the ER with atypical chest pain in September 2020 and she states that she was started on diltiazem at that time for her chest pain and also for possible esophageal spasms.  The patient has been told that she will be set up for an upper endoscopy to look for any signs of reflux or weakening of the antireflux surgery.  This will determine whether she needs to go back on a proton pump inhibitor.  The patient has been explained the plan and agrees with it.    Julie Lame, MD. Marval Regal    Note: This dictation was prepared with Dragon dictation along with smaller phrase technology. Any transcriptional  errors that result from this process are unintentional.

## 2020-01-10 NOTE — H&P (View-Only) (Signed)
Gastroenterology Consultation  Referring Provider:     Virginia Crews, MD Primary Care Physician:  Virginia Crews, MD Primary Gastroenterologist:  Dr. Allen Norris     Reason for Consultation:     Chest pain        HPI:   Julie Berger is a 54 y.o. y/o female referred for consultation & management of chest pain. by Dr. Brita Romp, Dionne Bucy, MD.  This patient comes in today with a history of reflux.  The patient had been followed in the past in [place] but had moved here approximately 2 years ago.  She states that she was diagnosed with Barrett's esophagus and had ablation treatment with multiple sessions until the Barrett's was completely eradicated.  She also states that this was done by a surgeon who also then performed a antireflux procedure on her.  She now reports that she is having some symptoms of acid reflux but is not on any medication for acid reflux.  She denies any dysphagia nausea vomiting fevers or chills.  The patient states that she is up-to-date with her colon cancer screening because she had a negative Cologuard sent off.  Despite the patient's recent GI symptoms she also states that she is trying to establish care with a local gastroenterologist.  She denies any black stools or bloody stools.  She also denies any change in bowel habits.  Patient had a chest x-ray in September 2020 that showed low lung volumes with postoperative changes in the upper abdomen and lower thoracic spine stimulating device.  Past Medical History:  Diagnosis Date  . Atrial fibrillation ([place])   . Barrett esophagus   . Blood transfusion without reported diagnosis   . Cancer (HCC)    cervical  . Chronic pain   . Complex regional pain syndrome I   . 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 BACK SURGERY     x 3  . CHOLECYSTECTOMY    . HIATAL HERNIA REPAIR    . HYSTERECTOMY ABDOMINAL WITH  SALPINGO-OOPHORECTOMY    . KNEE ARTHROSCOPY Left   . SPINAL CORD STIMULATOR IMPLANT    . TONSILLECTOMY    . TRANSORAL INCISIONLESS FUNDOPLICATION    . ULNAR NERVE TRANSPOSITION Left     Prior to Admission medications   Medication Sig Start Date End Date Taking? Authorizing Provider  buPROPion (WELLBUTRIN XL) 150 MG 24 hr tablet Take 150 mg by mouth daily.    [provider]  diltiazem (CARDIZEM CD) 120 MG 24 hr capsule Take by mouth. 02/01/19 02/01/20  [provider]  naloxone Karma Greaser) 4 MG/0.1ML LIQD nasal spray kit USE UTD FOR SUSPECTED OVERDOSE. 12/21/17   [provider]  oxyCODONE-acetaminophen (PERCOCET) 10-325 MG tablet Take 1 tablet by mouth 4 (four) times daily as needed. 10/01/18   [provider]  pregabalin (LYRICA) 150 MG capsule Take 150 mg by mouth 3 (three) times daily. 10/10/18   [provider]    Family History  Problem Relation Age of Onset  . Heart attack Father   . Mitral valve prolapse Father   . Hypertension Father   . Dementia Mother   . Lupus Mother   . Fibromyalgia Mother   . Diabetes Brother   . Cancer Brother        unknown type  . Colon cancer Neg Hx   . Breast cancer Neg Hx      Social  History   Tobacco Use  . Smoking status: Former Smoker    Packs/day: 1.00    Years: 20.00    Pack years: 20.00    Types: Cigarettes    Quit date: 08/22/1999    Years since quitting: 20.4  . Smokeless tobacco: Never Used  Substance Use Topics  . Alcohol use: Never  . Drug use: Never    Allergies as of 01/10/2020 - Review Complete 11/02/2019  Allergen Reaction Noted  . Nsaids Shortness Of Breath 08/22/2019    Review of Systems:    All systems reviewed and negative except where noted in HPI.   Physical Exam:  There were no vitals taken for this visit. No LMP recorded. Patient has had a hysterectomy. General:   Alert,  Well-developed, well-nourished, pleasant and cooperative in NAD Head:  Normocephalic and  atraumatic. Eyes:  Sclera clear, no icterus.   Conjunctiva pink. Ears:  Normal auditory acuity. Neck:  Supple; no masses or thyromegaly. Lungs:  Respirations even and unlabored.  Clear throughout to auscultation.   No wheezes, crackles, or rhonchi. No acute distress. Heart:  Regular rate and rhythm; no murmurs, clicks, rubs, or gallops. Abdomen:  Normal bowel sounds.  No bruits.  Soft, non-tender and non-distended without masses, hepatosplenomegaly or hernias noted.  No guarding or rebound tenderness.  Negative Carnett sign.   Rectal:  Deferred.  Pulses:  Normal pulses noted. Extremities:  No clubbing or edema.  No cyanosis. Neurologic:  Alert and oriented x3;  grossly normal neurologically. Skin:  Intact without significant lesions or rashes.  No jaundice. Lymph Nodes:  No significant cervical adenopathy. Psych:  Alert and cooperative. Normal mood and affect.  Imaging Studies: No results found.  Assessment and Plan:   Julie Berger is a 54 y.o. y/o female who comes in today with a history of Barrett's esophagus for which she states she had ablation of this.  She also reports that she had antireflux surgery.  There is no report of any maintenance therapy for reflux and states that she has been doing well until recently and now she is starting to have some symptoms of reflux.  The patient was in the ER with atypical chest pain in September 2020 and she states that she was started on diltiazem at that time for her chest pain and also for possible esophageal spasms.  The patient has been told that she will be set up for an upper endoscopy to look for any signs of reflux or weakening of the antireflux surgery.  This will determine whether she needs to go back on a proton pump inhibitor.  The patient has been explained the plan and agrees with it.    Lucilla Lame, MD. Marval Regal    Note: This dictation was prepared with Dragon dictation along with smaller phrase technology. Any transcriptional  errors that result from this process are unintentional.

## 2020-01-12 ENCOUNTER — Other Ambulatory Visit: Payer: Self-pay

## 2020-01-12 DIAGNOSIS — K2100 Gastro-esophageal reflux disease with esophagitis, without bleeding: Secondary | ICD-10-CM

## 2020-01-17 ENCOUNTER — Telehealth: Payer: Self-pay

## 2020-01-17 NOTE — Telephone Encounter (Signed)
Patient LVM for me to call her.  Returned call LVM for her to call back.  Thanks,  [place], [place]

## 2020-01-17 NOTE — Progress Notes (Signed)
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 screen and psych assessment could consider taking over patient's chronic Percocet regimen.    Lab Orders     Compliance Drug Analysis, Ur  Referral Orders     Ambulatory referral to Psychology  Pharmacological management options:  Opioid Analgesics: The patient was informed that there is no guarantee that she would be a candidate for opioid analgesics. The decision will be made following CDC guidelines. This decision will be based on the results of diagnostic studies, as well as  Julie Berger's risk profile.   Membrane stabilizer: Adequate regimen  Muscle relaxant: To be determined at a later time  NSAID: To be determined at a later time  Other analgesic(s): To be determined at a later time   Provider-requested follow-up: Return for  after psych visit and after we have received records from previous pain clinic.  Future Appointments  Date Time Provider Bright  02/17/2020  1:20 PM Bacigalupo, Dionne Bucy, MD [facility]    Note by: Gillis Santa, MD Date: 01/18/2020; Time: 12:59 PM

## 2020-01-18 ENCOUNTER — Telehealth: Payer: Self-pay

## 2020-01-18 ENCOUNTER — Other Ambulatory Visit: Payer: Self-pay

## 2020-01-18 ENCOUNTER — Ambulatory Visit
Payer: Medicaid Other | Attending: Student in an Organized Health Care Education/Training Program | Admitting: Student in an Organized Health Care Education/Training Program

## 2020-01-18 ENCOUNTER — Encounter: Payer: Self-pay | Admitting: Student in an Organized Health Care Education/Training Program

## 2020-01-18 ENCOUNTER — Ambulatory Visit: Payer: Medicaid Other | Admitting: Student in an Organized Health Care Education/Training Program

## 2020-01-18 VITALS — BP 124/84 | HR 87 | Temp 98.4°F | Resp 16 | Ht 64.0 in | Wt 187.0 lb

## 2020-01-18 DIAGNOSIS — Z79891 Long term (current) use of opiate analgesic: Secondary | ICD-10-CM | POA: Diagnosis not present

## 2020-01-18 DIAGNOSIS — M797 Fibromyalgia: Secondary | ICD-10-CM

## 2020-01-18 DIAGNOSIS — G90523 Complex regional pain syndrome I of lower limb, bilateral: Secondary | ICD-10-CM | POA: Diagnosis not present

## 2020-01-18 DIAGNOSIS — G894 Chronic pain syndrome: Secondary | ICD-10-CM

## 2020-01-18 NOTE — Telephone Encounter (Signed)
Copied from [place] [id number]. Topic: Referral - Status >> Jan 18, 2020  1:23 PM Erick Blinks wrote: Reason for CRM: Pt called to report, that she did not have a pleasant experience with current pain mgmt provider. She is requesting to see a new provider please advise  Best contact: [phone]

## 2020-01-19 ENCOUNTER — Other Ambulatory Visit: Payer: Self-pay | Admitting: Family Medicine

## 2020-01-19 ENCOUNTER — Ambulatory Visit: Payer: Medicaid Other | Admitting: Student in an Organized Health Care Education/Training Program

## 2020-01-19 NOTE — Telephone Encounter (Signed)
LMTCB

## 2020-01-19 NOTE — Telephone Encounter (Signed)
Spoke with the patient and advised her that Dr B is out of the office until Monday. Dr Sharmaine Base messages are currently all being forwarded to other providers, therefore I will forward this to her on Monday. This will still allow time before she runs out of medicine and before her surgery.

## 2020-01-19 NOTE — Telephone Encounter (Signed)
Pt stated she reached out to her old pain management clinic and they cannot see her until 02/07/19. She would like to know if Dr. B could refill her pain meds to get her until then. She is having surgery on 01/31/20 and will run out of medication on 01/30/20. Pt stated she did not feel safe with the pain clinic she was referred to. Please advise.  pregabalin (LYRICA) 150 MG capsule /oxyCODONE-acetaminophen (PERCOCET) 10-325 MG tablet

## 2020-01-20 NOTE — Telephone Encounter (Signed)
Ok to place new referral 

## 2020-01-20 NOTE — Addendum Note (Signed)
Addended by: Ashley Royalty E on: 01/20/2020 03:03 PM   Modules accepted: Orders

## 2020-01-23 NOTE — Telephone Encounter (Signed)
Dr B could you please review. Thanks

## 2020-01-23 NOTE — Telephone Encounter (Signed)
I reviewed her recent fills of her pain medications.  It looks like her medication will last her up until the day before surgery and then on day of surgery and afterward, her surgeon should provide her with pain management for that.  Who is the surgeon and what surgery is she having?  This should last her until her follow-up with her other pain management clinic.  I am also curious to know what made her feel unsafe at the pain management clinic that she was referred to, because we use this often for patients.  The surgeon may not refill her Lyrica, so we can refill that if she would like [place] to

## 2020-01-24 NOTE — Telephone Encounter (Signed)
LMTCB

## 2020-01-25 DIAGNOSIS — F438 Other reactions to severe stress: Secondary | ICD-10-CM | POA: Diagnosis not present

## 2020-01-25 LAB — COMPLIANCE DRUG ANALYSIS, UR

## 2020-01-25 NOTE — Telephone Encounter (Signed)
Patient is calling office back regarding medication. Call back [phone]

## 2020-01-25 NOTE — Telephone Encounter (Signed)
Patient advised as below. Patient reports that she will have endoscope done on 01/31/2020 with Dr. Allen Norris. Patient was advised to ask Dr. Allen Norris if he will refill her oxycodone. Patient advised that we will refill her Lyrica. Patient reports that she does not want to return to pain management clinic here in town, pt reports provider was very rude to her. Patient reports she will she her previous pain doctor in [place] on 02/07/2020.

## 2020-01-26 MED ORDER — PREGABALIN 150 MG PO CAPS: 150.0000 mg | ORAL_CAPSULE | Freq: Three times a day (TID) | ORAL | 1 refills | Status: AC

## 2020-01-30 ENCOUNTER — Telehealth: Payer: Self-pay | Admitting: Gastroenterology

## 2020-01-30 ENCOUNTER — Other Ambulatory Visit: Payer: Self-pay

## 2020-01-30 DIAGNOSIS — K2100 Gastro-esophageal reflux disease with esophagitis, without bleeding: Secondary | ICD-10-CM

## 2020-01-30 NOTE — Telephone Encounter (Signed)
Pt left vm she was due to have a procedure with Dr. Allen Norris on 01/31/20 and went to have her Covid 19 test and [facility] the hospital and they said they no longer did them she was not instructed to go anywhere else please call pt

## 2020-01-31 ENCOUNTER — Other Ambulatory Visit
Admission: RE | Admit: 2020-01-31 | Discharge: 2020-01-31 | Disposition: A | Discharge status: Home / Self Care | Payer: Medicaid Other | Source: Ambulatory Visit | Attending: Gastroenterology | Admitting: Gastroenterology

## 2020-01-31 ENCOUNTER — Ambulatory Visit: Admit: 2020-01-31 | Payer: Medicaid Other | Admitting: Gastroenterology

## 2020-01-31 ENCOUNTER — Other Ambulatory Visit: Payer: Self-pay

## 2020-01-31 DIAGNOSIS — Z01812 Encounter for preprocedural laboratory examination: Secondary | ICD-10-CM | POA: Diagnosis not present

## 2020-01-31 DIAGNOSIS — Z20822 Contact with and (suspected) exposure to covid-19: Secondary | ICD-10-CM | POA: Insufficient documentation

## 2020-01-31 LAB — SARS CORONAVIRUS 2 (TAT 6-24 HRS): SARS Coronavirus 2: NEGATIVE

## 2020-01-31 SURGERY — ESOPHAGOGASTRODUODENOSCOPY (EGD) WITH PROPOFOL
Anesthesia: General

## 2020-02-01 ENCOUNTER — Encounter: Payer: Self-pay | Admitting: Gastroenterology

## 2020-02-02 ENCOUNTER — Encounter
Admission: RE | Disposition: A | Discharge status: Home / Self Care | Payer: Self-pay | Source: Home / Self Care | Attending: Gastroenterology

## 2020-02-02 ENCOUNTER — Ambulatory Visit: Payer: Medicaid Other | Admitting: Anesthesiology

## 2020-02-02 ENCOUNTER — Encounter: Payer: Self-pay | Admitting: Gastroenterology

## 2020-02-02 ENCOUNTER — Ambulatory Visit
Admission: RE | Admit: 2020-02-02 | Discharge: 2020-02-02 | Disposition: A | Discharge status: Home / Self Care | Payer: Medicaid Other | Attending: Gastroenterology | Admitting: Gastroenterology

## 2020-02-02 DIAGNOSIS — M797 Fibromyalgia: Secondary | ICD-10-CM | POA: Insufficient documentation

## 2020-02-02 DIAGNOSIS — Z79899 Other long term (current) drug therapy: Secondary | ICD-10-CM | POA: Insufficient documentation

## 2020-02-02 DIAGNOSIS — R079 Chest pain, unspecified: Secondary | ICD-10-CM | POA: Insufficient documentation

## 2020-02-02 DIAGNOSIS — T18108A Unspecified foreign body in esophagus causing other injury, initial encounter: Secondary | ICD-10-CM | POA: Insufficient documentation

## 2020-02-02 DIAGNOSIS — Z8719 Personal history of other diseases of the digestive system: Secondary | ICD-10-CM | POA: Diagnosis not present

## 2020-02-02 DIAGNOSIS — Z87891 Personal history of nicotine dependence: Secondary | ICD-10-CM | POA: Diagnosis not present

## 2020-02-02 DIAGNOSIS — G7109 Other specified muscular dystrophies: Secondary | ICD-10-CM | POA: Insufficient documentation

## 2020-02-02 DIAGNOSIS — Z8541 Personal history of malignant neoplasm of cervix uteri: Secondary | ICD-10-CM | POA: Diagnosis not present

## 2020-02-02 DIAGNOSIS — K219 Gastro-esophageal reflux disease without esophagitis: Secondary | ICD-10-CM | POA: Diagnosis not present

## 2020-02-02 DIAGNOSIS — Z9889 Other specified postprocedural states: Secondary | ICD-10-CM | POA: Diagnosis not present

## 2020-02-02 DIAGNOSIS — G9050 Complex regional pain syndrome I, unspecified: Secondary | ICD-10-CM | POA: Diagnosis not present

## 2020-02-02 DIAGNOSIS — X58XXXA Exposure to other specified factors, initial encounter: Secondary | ICD-10-CM | POA: Insufficient documentation

## 2020-02-02 DIAGNOSIS — K2100 Gastro-esophageal reflux disease with esophagitis, without bleeding: Secondary | ICD-10-CM

## 2020-02-02 DIAGNOSIS — T18198A Other foreign object in esophagus causing other injury, initial encounter: Secondary | ICD-10-CM

## 2020-02-02 DIAGNOSIS — Z9682 Presence of neurostimulator: Secondary | ICD-10-CM | POA: Diagnosis not present

## 2020-02-02 DIAGNOSIS — R0789 Other chest pain: Secondary | ICD-10-CM | POA: Diagnosis not present

## 2020-02-02 HISTORY — PX: ESOPHAGOGASTRODUODENOSCOPY (EGD) WITH PROPOFOL: SHX5813

## 2020-02-02 SURGERY — ESOPHAGOGASTRODUODENOSCOPY (EGD) WITH PROPOFOL
Anesthesia: General

## 2020-02-02 MED ORDER — PROPOFOL 10 MG/ML IV BOLUS
INTRAVENOUS | Status: DC | PRN
  Administered 2020-02-02: 50 mg via INTRAVENOUS
  Administered 2020-02-02: 100 mg via INTRAVENOUS
  Administered 2020-02-02: 50 mg via INTRAVENOUS

## 2020-02-02 MED ORDER — SODIUM CHLORIDE 0.9 % IV SOLN
INTRAVENOUS | Status: DC
  Administered 2020-02-02: 1000 mL via INTRAVENOUS

## 2020-02-02 NOTE — Op Note (Signed)
[facility] Gastroenterology Patient Name: Julie Berger Procedure Date: 02/02/2020 1:55 PM MRN: [id number] Account #: [id number] Date of Birth: Mar 02, 1966 Admit Type: Outpatient Age: 54 Room: [facility] ENDO ROOM 4 Gender: Female Note Status: Finalized Procedure:             Upper GI endoscopy Indications:           Chest pain (non cardiac) Providers:             Lucilla Lame MD, MD Referring MD:          Dionne Bucy. Bacigalupo (Referring MD) Medicines:             Propofol per Anesthesia Complications:         No immediate complications. Procedure:             Pre-Anesthesia Assessment:                        - Prior to the procedure, a History and Physical was                         performed, and patient medications and allergies were                         reviewed. The patient's tolerance of previous                         anesthesia was also reviewed. The risks and benefits                         of the procedure and the sedation options and risks                         were discussed with the patient. All questions were                         answered, and informed consent was obtained. Prior                         Anticoagulants: The patient has taken no previous                         anticoagulant or antiplatelet agents. ASA Grade                         Assessment: II - A patient with mild systemic disease.                         After reviewing the risks and benefits, the patient                         was deemed in satisfactory condition to undergo the                         procedure.                        After obtaining informed consent, the endoscope was  passed under direct vision. Throughout the procedure,                         the patient's blood pressure, pulse, and oxygen                         saturations were monitored continuously. The Endoscope                         was introduced through the mouth, and  advanced to the                         second part of duodenum. The upper GI endoscopy was                         accomplished without difficulty. The patient tolerated                         the procedure well. Findings:      A T -fasener was found in the lower third of the esophagus. Removal was       accomplished with a regular forceps.      Evidence of a was found in the lower third of the esophagus. The wrap       appeared intact. This was traversed.      The stomach was normal.      The examined duodenum was normal. Impression:            - A T -fasener were found in the esophagus. Removal                         was successful.                        - A was found. The wrap appears intact.                        - Normal stomach.                        - Normal examined duodenum. Recommendation:        - Discharge patient to home.                        - Resume previous diet.                        - Continue present medications. Procedure Code(s):     --- Professional ---                        [phone], Esophagogastroduodenoscopy, flexible,                         transoral; with removal of foreign body(s) Diagnosis Code(s):     --- Professional ---                        R07.89, Other chest pain                        T18.198A, Other foreign object in  esophagus causing                         other injury, initial encounter CPT copyright 2019 American Medical Association. All rights reserved. The codes documented in this report are preliminary and upon coder review may  be revised to meet current compliance requirements. Lucilla Lame MD, MD 02/02/2020 2:14:18 PM This report has been signed electronically. Number of Addenda: 0 Note Initiated On: 02/02/2020 1:55 PM Estimated Blood Loss:  Estimated blood loss: none.      [facility]

## 2020-02-02 NOTE — Interval H&P Note (Signed)
History and Physical Interval Note:  02/02/2020 12:56 PM  Julie Berger  has presented today for surgery, with the diagnosis of GERD K21.9.  The various methods of treatment have been discussed with the patient and family. After consideration of risks, benefits and other options for treatment, the patient has consented to  Procedure(s): ESOPHAGOGASTRODUODENOSCOPY (EGD) WITH PROPOFOL (N/A) as a surgical intervention.  The patient's history has been reviewed, patient examined, no change in status, stable for surgery.  I have reviewed the patient's chart and labs.  Questions were answered to the patient's satisfaction.     Taylor Levick [employer]

## 2020-02-02 NOTE — Anesthesia Preprocedure Evaluation (Addendum)
Anesthesia Evaluation  Patient identified by MRN, date of birth, ID band Patient awake    Reviewed: Allergy & Precautions, H&P , NPO status , Patient's Chart, lab work & pertinent test results  Airway Mallampati: II  TM Distance: >3 FB Neck ROM: full    Dental  (+) Upper Dentures   Pulmonary former smoker,           Cardiovascular + dysrhythmias Atrial Fibrillation      Neuro/Psych PSYCHIATRIC DISORDERS Depression CRPS    GI/Hepatic negative GI ROS, Neg liver ROS,   Endo/Other  negative endocrine ROS  Renal/GU negative Renal ROS  negative genitourinary   Musculoskeletal   Abdominal   Peds  Hematology negative hematology ROS (+)   Anesthesia Other Findings Past Medical History: No date: Atrial fibrillation (HCC) No date: Barrett esophagus No date: Blood transfusion without reported diagnosis No date: Cancer ([place])     Comment:  cervical No date: Chronic pain No date: Complex regional pain syndrome I No date: Fibromyalgia     Comment:  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  Past Surgical History: No date: BACK SURGERY     Comment:  x 3 No date: CHOLECYSTECTOMY No date: HIATAL HERNIA REPAIR No date: HYSTERECTOMY ABDOMINAL WITH SALPINGO-OOPHORECTOMY No date: KNEE ARTHROSCOPY; Left No date: SPINAL CORD STIMULATOR IMPLANT No date: TONSILLECTOMY No date: TRANSORAL INCISIONLESS FUNDOPLICATION No date: ULNAR NERVE TRANSPOSITION; Left     Reproductive/Obstetrics negative OB ROS                           Anesthesia Physical Anesthesia Plan  ASA: II  Anesthesia Plan: General   Post-op Pain Management:    Induction:   PONV Risk Score and Plan: Propofol infusion and TIVA  Airway Management Planned: Natural Airway and Nasal Cannula  Additional Equipment:   Intra-op Plan:   Post-operative Plan:   Informed Consent: I have reviewed the patients History and Physical,  chart, labs and discussed the procedure including the risks, benefits and alternatives for the proposed anesthesia with the patient or authorized representative who has indicated his/her understanding and acceptance.     Dental Advisory Given  Plan Discussed with: Anesthesiologist  Anesthesia Plan Comments:        Anesthesia Quick Evaluation

## 2020-02-02 NOTE — Transfer of Care (Signed)
Immediate Anesthesia Transfer of Care Note  Patient: Julie Berger  Procedure(s) Performed: ESOPHAGOGASTRODUODENOSCOPY (EGD) WITH PROPOFOL (N/A )  Patient Location: PACU  Anesthesia Type:General  Level of Consciousness: sedated  Airway & Oxygen Therapy: Patient Spontanous Breathing  Post-op Assessment: Report given to RN and Post -op Vital signs reviewed and stable  Post vital signs: Reviewed and stable  Last Vitals:  Vitals Value Taken Time  BP 103/81 02/02/20 1414  Temp    Pulse 91 02/02/20 1416  Resp 16 02/02/20 1416  SpO2 92 % 02/02/20 1416  Vitals shown include unvalidated device data.  Last Pain:  Vitals:   02/02/20 1414  TempSrc:   PainSc: Asleep      Patients Stated Pain Goal: 4 ([id number])  Complications: No apparent anesthesia complications

## 2020-02-03 ENCOUNTER — Encounter: Payer: Self-pay | Admitting: *Deleted

## 2020-02-03 ENCOUNTER — Encounter: Payer: Medicaid Other | Admitting: Family Medicine

## 2020-02-03 ENCOUNTER — Telehealth: Payer: Self-pay | Admitting: *Deleted

## 2020-02-03 NOTE — Telephone Encounter (Signed)
From [facility].  Julie Berger (Patient) Julie Berger (Patient) General - Inquiry  Summary: pain medication  Reason for CRM: Patient called in to inform PCP that she had surgery yesterday, and was not given any pain medication. Patient stated that PCP and her discussed PCP prescribing pain medication for her, if this was to happen, until patient sees pain management. Please advise

## 2020-02-04 NOTE — Anesthesia Postprocedure Evaluation (Signed)
Anesthesia Post Note  Patient: Julie Berger  Procedure(s) Performed: ESOPHAGOGASTRODUODENOSCOPY (EGD) WITH PROPOFOL (N/A )  Patient location during evaluation: PACU Anesthesia Type: General Level of consciousness: awake and alert Pain management: pain level controlled Vital Signs Assessment: post-procedure vital signs reviewed and stable Respiratory status: spontaneous breathing, nonlabored ventilation and respiratory function stable Cardiovascular status: blood pressure returned to baseline and stable Postop Assessment: no apparent nausea or vomiting Anesthetic complications: no     Last Vitals:  Vitals:   02/02/20 1434 02/02/20 1444  BP: 112/86 137/84  Pulse:    Resp:    Temp:    SpO2:      Last Pain:  Vitals:   02/03/20 0801  TempSrc:   PainSc: 0-No pain                 Tera Mater

## 2020-02-06 MED ORDER — OXYCODONE-ACETAMINOPHEN 10-325 MG PO TABS: 1.0000 | ORAL_TABLET | Freq: Four times a day (QID) | ORAL | 0 refills | Status: DC | PRN

## 2020-02-06 NOTE — Telephone Encounter (Signed)
Rx sent for 1 month supply. 

## 2020-02-06 NOTE — Telephone Encounter (Signed)
Left message on voicemail.

## 2020-02-07 DIAGNOSIS — Z5181 Encounter for therapeutic drug level monitoring: Secondary | ICD-10-CM | POA: Diagnosis not present

## 2020-02-07 DIAGNOSIS — M797 Fibromyalgia: Secondary | ICD-10-CM | POA: Diagnosis not present

## 2020-02-07 DIAGNOSIS — G8929 Other chronic pain: Secondary | ICD-10-CM | POA: Diagnosis not present

## 2020-02-14 DIAGNOSIS — K2270 Barrett's esophagus without dysplasia: Secondary | ICD-10-CM | POA: Diagnosis not present

## 2020-02-14 DIAGNOSIS — I480 Paroxysmal atrial fibrillation: Secondary | ICD-10-CM | POA: Diagnosis not present

## 2020-02-14 DIAGNOSIS — E6609 Other obesity due to excess calories: Secondary | ICD-10-CM | POA: Diagnosis not present

## 2020-02-14 DIAGNOSIS — G894 Chronic pain syndrome: Secondary | ICD-10-CM | POA: Diagnosis not present

## 2020-02-17 ENCOUNTER — Encounter: Payer: Self-pay | Admitting: Family Medicine

## 2020-02-17 ENCOUNTER — Other Ambulatory Visit: Payer: Self-pay

## 2020-02-17 ENCOUNTER — Ambulatory Visit: Payer: Medicaid Other | Admitting: Family Medicine

## 2020-02-17 VITALS — BP 111/81 | HR 90 | Temp 96.9°F | Wt 190.0 lb

## 2020-02-17 DIAGNOSIS — Z23 Encounter for immunization: Secondary | ICD-10-CM | POA: Diagnosis not present

## 2020-02-17 DIAGNOSIS — Z114 Encounter for screening for human immunodeficiency virus [HIV]: Secondary | ICD-10-CM | POA: Diagnosis not present

## 2020-02-17 DIAGNOSIS — Z1231 Encounter for screening mammogram for malignant neoplasm of breast: Secondary | ICD-10-CM

## 2020-02-17 DIAGNOSIS — G894 Chronic pain syndrome: Secondary | ICD-10-CM

## 2020-02-17 DIAGNOSIS — Z6832 Body mass index (BMI) 32.0-32.9, adult: Secondary | ICD-10-CM | POA: Diagnosis not present

## 2020-02-17 DIAGNOSIS — Z1159 Encounter for screening for other viral diseases: Secondary | ICD-10-CM | POA: Diagnosis not present

## 2020-02-17 DIAGNOSIS — E669 Obesity, unspecified: Secondary | ICD-10-CM

## 2020-02-17 DIAGNOSIS — F3341 Major depressive disorder, recurrent, in partial remission: Secondary | ICD-10-CM | POA: Diagnosis not present

## 2020-02-17 DIAGNOSIS — Z0000 Encounter for general adult medical examination without abnormal findings: Secondary | ICD-10-CM | POA: Diagnosis not present

## 2020-02-17 DIAGNOSIS — I480 Paroxysmal atrial fibrillation: Secondary | ICD-10-CM

## 2020-02-17 DIAGNOSIS — R739 Hyperglycemia, unspecified: Secondary | ICD-10-CM

## 2020-02-17 NOTE — Assessment & Plan Note (Signed)
Currently in NSR Followed by cardiology Continue Diltiazem for rate control Not on anticoag

## 2020-02-17 NOTE — Assessment & Plan Note (Signed)
Planning to see a new clinic Does not want to see Dr Holley Raring again

## 2020-02-17 NOTE — Progress Notes (Signed)
Patient: Julie Berger, Female    DOB: 1965-12-08, 54 y.o.   MRN: [id number] Visit Date: 02/17/2020  Today's Provider: Lavon Paganini, MD   Chief Complaint  Patient presents with  . Annual Exam  . Dizziness    Episode last month, has since resolved.    Subjective:     Annual physical exam Julie Berger is a 54 y.o. female who presents today for health maintenance and complete physical. She feels well. She reports exercising some. She reports she is sleeping fairly well.  -----------------------------------------------------------------   Review of Systems  Constitutional: Negative.   HENT: Positive for dental problem and tinnitus. Negative for congestion, drooling, ear discharge, ear pain, facial swelling, hearing loss, mouth sores, nosebleeds, postnasal drip, rhinorrhea, sinus pressure, sinus pain, sneezing, sore throat, trouble swallowing and voice change.   Eyes: Positive for photophobia. Negative for pain, discharge, redness, itching and visual disturbance.  Respiratory: Negative.   Cardiovascular: Positive for chest pain and palpitations. Negative for leg swelling.  Gastrointestinal: Positive for constipation and diarrhea. Negative for abdominal distention, abdominal pain, anal bleeding, blood in stool, nausea, rectal pain and vomiting.  Endocrine: Positive for polydipsia. Negative for cold intolerance, heat intolerance, polyphagia and polyuria.  Genitourinary: Negative.   Musculoskeletal: Positive for arthralgias, back pain and myalgias. Negative for gait problem, joint swelling, neck pain and neck stiffness.  Skin: Negative.   Allergic/Immunologic: Negative.   Neurological: Negative.   Hematological: Negative.   Psychiatric/Behavioral: Negative.     Social History      She  reports that she quit smoking about 20 years ago. Her smoking use included cigarettes. She has a 20.00 pack-year smoking history. She has never used smokeless tobacco. She reports that she  does not drink alcohol or use drugs.       Social History   Socioeconomic History  . Marital status: Divorced    Spouse name: Not on file  . Number of children: 6  . Years of education: Not on file  . Highest education level: Not on file  Occupational History  . Occupation: Disabiled  Tobacco Use  . Smoking status: Former Smoker    Packs/day: 1.00    Years: 20.00    Pack years: 20.00    Types: Cigarettes    Quit date: 08/22/1999    Years since quitting: 20.5  . Smokeless tobacco: Never Used  Substance and Sexual Activity  . Alcohol use: Never  . Drug use: Never  . Sexual activity: Never  Other Topics Concern  . Not on file  Social History Narrative  . Not on file   Social Determinants of Health   Financial Resource Strain:   . Difficulty of Paying Living Expenses:   Food Insecurity:   . Worried About Charity fundraiser in the Last Year:   . Arboriculturist in the Last Year:   Transportation Needs:   . Film/video editor (Medical):   Marland Kitchen Lack of Transportation (Non-Medical):   Physical Activity:   . Days of Exercise per Week:   . Minutes of Exercise per Session:   Stress:   . Feeling of Stress :   Social Connections:   . Frequency of Communication with Friends and Family:   . Frequency of Social Gatherings with Friends and Family:   . Attends Religious Services:   . Active Member of Clubs or Organizations:   . Attends Archivist Meetings:   Marland Kitchen Marital Status:     Past Medical History:  Diagnosis Date  . Atrial fibrillation ([place])   . Barrett esophagus   . Blood transfusion without reported diagnosis   . Cancer (HCC)    cervical  . Chronic pain   . Complex regional pain syndrome I   . 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 Other chest pain   . Other foreign object in esophagus causing other injury, initial encounter   . Encounter for long-term opiate  analgesic use 01/18/2020  . Gastroesophageal reflux disease with esophagitis 11/03/2019  . Chronic pain 11/02/2019  . History of cervical cancer 11/02/2019  . Fibromyalgia 11/02/2019  . Obesity 11/02/2019  . Not currently working due to disabled status 11/02/2019  . MDD (major depressive disorder) 11/02/2019  . Complex regional pain syndrome I 11/02/2019  . Paroxysmal atrial fibrillation ([place]) 11/02/2019    Past Surgical History:  Procedure Laterality Date  . BACK SURGERY     x 3  . CHOLECYSTECTOMY    . ESOPHAGOGASTRODUODENOSCOPY (EGD) WITH PROPOFOL N/A 02/02/2020   Procedure: ESOPHAGOGASTRODUODENOSCOPY (EGD) WITH PROPOFOL;  Surgeon: Lucilla Lame, MD;  Location: [facility] ENDOSCOPY;  Service: Endoscopy;  Laterality: N/A;  . HIATAL HERNIA REPAIR    . HYSTERECTOMY ABDOMINAL WITH SALPINGO-OOPHORECTOMY    . KNEE ARTHROSCOPY Left   . SPINAL CORD STIMULATOR IMPLANT    . TONSILLECTOMY    . TRANSORAL INCISIONLESS FUNDOPLICATION    . ULNAR NERVE TRANSPOSITION Left     Family History        Family Status  Relation Name Status  . Father  Alive  . Mother  Alive  . Brother  (Not Specified)  . Neg Hx  (Not Specified)        Her family history includes Cancer in her brother; Dementia in her mother; Diabetes in her brother; Fibromyalgia in her mother; Heart attack in her father; Hypertension in her father; Lupus in her mother; Mitral valve prolapse in her father. There is no history of Colon cancer or Breast cancer.      Allergies  Allergen Reactions  . Nsaids Shortness Of Breath     Current Outpatient Medications:  .  buPROPion (WELLBUTRIN XL) 150 MG 24 hr tablet, Take 150 mg by mouth daily., Disp: , Rfl:  .  naloxone (NARCAN) 4 MG/0.1ML LIQD nasal spray kit, USE UTD FOR SUSPECTED OVERDOSE., Disp: , Rfl:  .  pregabalin (LYRICA) 150 MG capsule, Take 1 capsule (150 mg total) by mouth 3 (three) times daily., Disp: 90 capsule, Rfl: 1 .  diltiazem (CARDIZEM CD) 120 MG 24 hr capsule, Take by  mouth., Disp: , Rfl:  .  oxyCODONE-acetaminophen (PERCOCET) 10-325 MG tablet, Take 1 tablet by mouth 4 (four) times daily as needed. (Patient not taking: Reported on 02/17/2020), Disp: 105 tablet, Rfl: 0   Patient Care Team: Virginia Crews, MD as PCP - General (Family Medicine)    Objective:    Vitals: BP 111/81 (BP Location: Left Arm, Patient Position: Sitting, Cuff Size: Large)   Pulse 90   Temp (!) 96.9 F (36.1 C) (Temporal)   Wt 190 lb (86.2 kg)   BMI 32.61 kg/m    Vitals:   02/17/20 1335  BP: 111/81  Pulse: 90  Temp: (!) 96.9 F (36.1 C)  TempSrc: Temporal  Weight: 190 lb (86.2 kg)     Physical Exam Vitals reviewed.  Constitutional:      General: She is not in acute  distress.    Appearance: Normal appearance. She is well-developed. She is not diaphoretic.  HENT:     Head: Normocephalic and atraumatic.     Right Ear: Tympanic membrane, ear canal and external ear normal.     Left Ear: Tympanic membrane, ear canal and external ear normal.  Eyes:     General: No scleral icterus.    Conjunctiva/sclera: Conjunctivae normal.     Pupils: Pupils are equal, round, and reactive to light.  Neck:     Thyroid: No thyromegaly.  Cardiovascular:     Rate and Rhythm: Normal rate and regular rhythm.     Heart sounds: Normal heart sounds. No murmur.  Pulmonary:     Effort: Pulmonary effort is normal. No respiratory distress.     Breath sounds: Normal breath sounds. No wheezing or rales.  Abdominal:     General: There is no distension.     Palpations: Abdomen is soft.     Tenderness: There is no abdominal tenderness.  Musculoskeletal:     Cervical back: Neck supple.     Right lower leg: No edema.     Left lower leg: No edema.  Lymphadenopathy:     Cervical: No cervical adenopathy.  Skin:    General: Skin is warm and dry.     Findings: No rash.  Neurological:     Mental Status: She is alert and oriented to person, place, and time. Mental status is at baseline.      Comments: Walks with a cane  Psychiatric:        Mood and Affect: Mood normal.        Behavior: Behavior normal.        Thought Content: Thought content normal.      Depression Screen PHQ 2/9 Scores 02/17/2020 11/02/2019  PHQ - 2 Score 2 3  PHQ- 9 Score 7 12       Assessment & Plan:     Routine Health Maintenance and Physical Exam  Exercise Activities and Dietary recommendations Goals   None     Immunization History  Administered Date(s) Administered  . Influenza,inj,Quad PF,6+ Mos 11/11/2019  . Tdap 02/17/2020  . Zoster Recombinat (Shingrix) 02/17/2020    Health Maintenance  Topic Date Due  . Hepatitis C Screening  Never done  . HIV Screening  Never done  . MAMMOGRAM  12/17/2019  . Fecal DNA (Cologuard)  12/28/2022  . TETANUS/TDAP  02/16/2030  . INFLUENZA VACCINE  Completed     Discussed health benefits of physical activity, and encouraged her to engage in regular exercise appropriate for her age and condition.    --------------------------------------------------------------------  Problem List Items Addressed This Visit      Cardiovascular and Mediastinum   Paroxysmal atrial fibrillation (HCC)    Currently in NSR Followed by cardiology Continue Diltiazem for rate control Not on anticoag        Other   Chronic pain    Planning to see a new clinic Does not want to see Dr Holley Raring again      Obesity    Discussed importance of healthy weight management Discussed diet and exercise       Relevant Orders   Lipid panel   Comprehensive metabolic panel   MDD (major depressive disorder)    Followed by psych and seeing therapist Continue current meds       Other Visit Diagnoses    Encounter for annual physical exam    -  Primary   Relevant Orders  Hepatitis C Antibody   HIV antibody (with reflex)   MM 3D SCREEN BREAST BILATERAL   Hemoglobin A1c   Lipid panel   Comprehensive metabolic panel   Breast cancer screening by mammogram        Relevant Orders   MM 3D SCREEN BREAST BILATERAL   Hyperglycemia       Relevant Orders   Hemoglobin A1c   Need for hepatitis C screening test       Relevant Orders   Hepatitis C Antibody   Screening for HIV (human immunodeficiency virus)       Relevant Orders   HIV antibody (with reflex)   Need for shingles vaccine       Relevant Orders   Varicella-zoster vaccine IM (Shingrix) (Completed)   Need for Tdap vaccination       Relevant Orders   Tdap vaccine greater than or equal to 7yo IM (Completed)       Return in about 1 year (around 02/16/2021) for CPE.   The entirety of the information documented in the History of Present Illness, Review of Systems and Physical Exam were personally obtained by me. Portions of this information were initially documented by Ashley Royalty, CMA and reviewed by me for thoroughness and accuracy.    Daylah Sayavong, Dionne Bucy, MD MPH [facility]

## 2020-02-17 NOTE — Assessment & Plan Note (Signed)
Discussed importance of healthy weight management Discussed diet and exercise  

## 2020-02-17 NOTE — Patient Instructions (Addendum)

## 2020-02-17 NOTE — Assessment & Plan Note (Signed)
Followed by psych and seeing therapist Continue current meds

## 2020-02-18 LAB — HEMOGLOBIN A1C
Est. average glucose Bld gHb Est-mCnc: 117 mg/dL
Hgb A1c MFr Bld: 5.7 % — ABNORMAL HIGH (ref 4.8–5.6)

## 2020-02-18 LAB — COMPREHENSIVE METABOLIC PANEL
ALT: 11 IU/L (ref 0–32)
AST: 16 IU/L (ref 0–40)
Albumin/Globulin Ratio: 1.8 (ref 1.2–2.2)
Albumin: 4.7 g/dL (ref 3.8–4.9)
Alkaline Phosphatase: 91 IU/L (ref 39–117)
BUN/Creatinine Ratio: 11 (ref 9–23)
BUN: 11 mg/dL (ref 6–24)
Bilirubin Total: 0.2 mg/dL (ref 0.0–1.2)
CO2: 24 mmol/L (ref 20–29)
Calcium: 9.3 mg/dL (ref 8.7–10.2)
Chloride: 102 mmol/L (ref 96–106)
Creatinine, Ser: 1.01 mg/dL — ABNORMAL HIGH (ref 0.57–1.00)
GFR calc Af Amer: 72 mL/min/{1.73_m2} (ref >59)
GFR calc non Af Amer: 63 mL/min/{1.73_m2} (ref >59)
Globulin, Total: 2.6 g/dL (ref 1.5–4.5)
Glucose: 92 mg/dL (ref 65–99)
Potassium: 4.8 mmol/L (ref 3.5–5.2)
Sodium: 138 mmol/L (ref 134–144)
Total Protein: 7.3 g/dL (ref 6.0–8.5)

## 2020-02-18 LAB — LIPID PANEL
Chol/HDL Ratio: 4.3 ratio (ref 0.0–4.4)
Cholesterol, Total: 205 mg/dL — ABNORMAL HIGH (ref 100–199)
HDL: 48 mg/dL (ref >39)
LDL Chol Calc (NIH): 132 mg/dL — ABNORMAL HIGH (ref 0–99)
Triglycerides: 137 mg/dL (ref 0–149)
VLDL Cholesterol Cal: 25 mg/dL (ref 5–40)

## 2020-02-18 LAB — HIV ANTIBODY (ROUTINE TESTING W REFLEX): HIV Screen 4th Generation wRfx: NONREACTIVE

## 2020-02-18 LAB — HEPATITIS C ANTIBODY: Hep C Virus Ab: <0.1 s/co ratio (ref 0.0–0.9)

## 2020-02-20 ENCOUNTER — Telehealth: Payer: Self-pay

## 2020-02-20 NOTE — Telephone Encounter (Signed)
Pt advised.   Thanks,   -Brennyn Ortlieb  

## 2020-02-20 NOTE — Telephone Encounter (Signed)
-----   Message from Virginia Crews, MD sent at 02/20/2020  9:15 AM EDT ----- Normal labs, except Hemoglobin A1c, 3 month avg of blood sugars, is in prediabetic range.  In order to prevent progression to diabetes, recommend low carb diet and regular exercise.  Cholesterol is high, but 10 year risk of heart disease/stroke is low at 2%.  I recommend diet low in saturated fat and regular exercise - 30 min at least 5 times per week.  No medication indicated at this time

## 2020-02-29 DIAGNOSIS — F438 Other reactions to severe stress: Secondary | ICD-10-CM | POA: Diagnosis not present

## 2020-03-06 DIAGNOSIS — M25562 Pain in left knee: Secondary | ICD-10-CM | POA: Diagnosis not present

## 2020-03-06 DIAGNOSIS — Z79891 Long term (current) use of opiate analgesic: Secondary | ICD-10-CM | POA: Diagnosis not present

## 2020-03-06 DIAGNOSIS — M797 Fibromyalgia: Secondary | ICD-10-CM | POA: Diagnosis not present

## 2020-03-06 DIAGNOSIS — G8929 Other chronic pain: Secondary | ICD-10-CM | POA: Diagnosis not present

## 2020-03-06 DIAGNOSIS — Z5181 Encounter for therapeutic drug level monitoring: Secondary | ICD-10-CM | POA: Diagnosis not present

## 2020-03-06 DIAGNOSIS — M25561 Pain in right knee: Secondary | ICD-10-CM | POA: Diagnosis not present

## 2020-03-08 DIAGNOSIS — I480 Paroxysmal atrial fibrillation: Secondary | ICD-10-CM | POA: Diagnosis not present

## 2020-03-17 DIAGNOSIS — Z23 Encounter for immunization: Secondary | ICD-10-CM | POA: Diagnosis not present

## 2020-03-25 ENCOUNTER — Telehealth: Payer: Self-pay

## 2020-03-26 NOTE — Telephone Encounter (Signed)
error 

## 2020-03-27 DIAGNOSIS — M797 Fibromyalgia: Secondary | ICD-10-CM | POA: Diagnosis not present

## 2020-03-27 DIAGNOSIS — K2270 Barrett's esophagus without dysplasia: Secondary | ICD-10-CM | POA: Diagnosis not present

## 2020-03-27 DIAGNOSIS — E6609 Other obesity due to excess calories: Secondary | ICD-10-CM | POA: Diagnosis not present

## 2020-03-27 DIAGNOSIS — I480 Paroxysmal atrial fibrillation: Secondary | ICD-10-CM | POA: Diagnosis not present

## 2020-04-03 DIAGNOSIS — F438 Other reactions to severe stress: Secondary | ICD-10-CM | POA: Diagnosis not present

## 2020-04-05 DIAGNOSIS — M25562 Pain in left knee: Secondary | ICD-10-CM | POA: Diagnosis not present

## 2020-04-05 DIAGNOSIS — M25561 Pain in right knee: Secondary | ICD-10-CM | POA: Diagnosis not present

## 2020-04-05 DIAGNOSIS — Z5181 Encounter for therapeutic drug level monitoring: Secondary | ICD-10-CM | POA: Diagnosis not present

## 2020-04-05 DIAGNOSIS — G8929 Other chronic pain: Secondary | ICD-10-CM | POA: Diagnosis not present

## 2020-04-05 DIAGNOSIS — M47816 Spondylosis without myelopathy or radiculopathy, lumbar region: Secondary | ICD-10-CM | POA: Diagnosis not present

## 2020-04-05 DIAGNOSIS — M797 Fibromyalgia: Secondary | ICD-10-CM | POA: Diagnosis not present

## 2020-04-14 DIAGNOSIS — Z23 Encounter for immunization: Secondary | ICD-10-CM | POA: Diagnosis not present

## 2020-04-25 DIAGNOSIS — F438 Other reactions to severe stress: Secondary | ICD-10-CM | POA: Diagnosis not present

## 2020-07-24 ENCOUNTER — Ambulatory Visit (INDEPENDENT_AMBULATORY_CARE_PROVIDER_SITE_OTHER): Payer: Medicaid Other | Admitting: Family Medicine

## 2020-07-24 ENCOUNTER — Other Ambulatory Visit: Payer: Self-pay

## 2020-07-24 ENCOUNTER — Encounter: Payer: Self-pay | Admitting: Family Medicine

## 2020-07-24 VITALS — BP 104/72 | HR 76 | Temp 98.2°F | Resp 16 | Wt 180.8 lb

## 2020-07-24 DIAGNOSIS — Z6831 Body mass index (BMI) 31.0-31.9, adult: Secondary | ICD-10-CM | POA: Diagnosis not present

## 2020-07-24 DIAGNOSIS — E669 Obesity, unspecified: Secondary | ICD-10-CM

## 2020-07-24 DIAGNOSIS — Z23 Encounter for immunization: Secondary | ICD-10-CM

## 2020-07-24 DIAGNOSIS — R7303 Prediabetes: Secondary | ICD-10-CM | POA: Diagnosis not present

## 2020-07-24 LAB — POCT GLYCOSYLATED HEMOGLOBIN (HGB A1C): Hemoglobin A1C: 6.3 % — AB (ref 4.0–5.6)

## 2020-07-24 MED ORDER — BUPROPION HCL ER (XL) 150 MG PO TB24: 150.0000 mg | ORAL_TABLET | Freq: Every day | ORAL | 0 refills | Status: DC

## 2020-07-24 NOTE — Patient Instructions (Addendum)
Diet Recommendations for Diabetes   Starchy (carb) foods include: Bread, rice, pasta, potatoes, corn, crackers, bagels, muffins, all baked goods.  (Fruits, milk, and yogurt also have carbohydrate, but most of these foods will not spike your blood sugar as the starchy foods will.)  A few fruits do cause high blood sugars; use small portions of bananas (limit to 1/2 at a time), grapes, watermelon, and most tropical fruits.    Protein foods include: Meat, fish, poultry, eggs, dairy foods, and beans such as pinto and kidney beans (beans also provide carbohydrate).   1. Eat at least 3 meals and 1-2 snacks per day. Never go more than 4-5 hours while awake without eating. Eat breakfast within the first hour of getting up.   2. Limit starchy foods to TWO per meal and ONE per snack. ONE portion of a starchy  food is equal to the following:   - ONE slice of bread (or its equivalent, such as half of a hamburger bun).   - 1/2 cup of a "scoopable" starchy food such as potatoes or rice.   - 15 grams of carbohydrate as shown on food label.  3. Include at every meal: a protein food, a carb food, and vegetables and/or fruit.   - Obtain twice as many veg's as protein or carbohydrate foods for both lunch and dinner.   - Fresh or frozen veg's are best.   - Try to keep frozen veg's on hand for a quick vegetable serving.        Prediabetes Prediabetes is the condition of having a blood sugar (blood glucose) level that is higher than it should be, but not high enough for you to be diagnosed with type 2 diabetes. Having prediabetes puts you at risk for developing type 2 diabetes (type 2 diabetes mellitus). Prediabetes may be called impaired glucose tolerance or impaired fasting glucose. Prediabetes usually does not cause symptoms. Your health care provider can diagnose this condition with blood tests. You may be tested for prediabetes if you are overweight and if you have at least one other risk factor for  prediabetes. What is blood glucose, and how is it measured? Blood glucose refers to the amount of glucose in your bloodstream. Glucose comes from eating foods that contain sugars and starches (carbohydrates), which the body breaks down into glucose. Your blood glucose level may be measured in mg/dL (milligrams per deciliter) or mmol/L (millimoles per liter). Your blood glucose may be checked with one or more of the following blood tests:  A fasting blood glucose (FBG) test. You will not be allowed to eat (you will fast) for 8 hours or longer before a blood sample is taken. ? A normal range for FBG is 70-100 mg/dl (3.9-5.6 mmol/L).  An A1c (hemoglobin A1c) blood test. This test provides information about blood glucose control over the previous 2?9months.  An oral glucose tolerance test (OGTT). This test measures your blood glucose at two times: ? After fasting. This is your baseline level. ? Two hours after you drink a beverage that contains glucose. You may be diagnosed with prediabetes:  If your FBG is 100?125 mg/dL (5.6-6.9 mmol/L).  If your A1c level is 5.7?6.4%.  If your OGTT result is 140?199 mg/dL (7.8-11 mmol/L). These blood tests may be repeated to confirm your diagnosis. How can this condition affect me? The pancreas produces a hormone (insulin) that helps to move glucose from the bloodstream into cells. When cells in the body do not respond properly to insulin that  the body makes (insulin resistance), excess glucose builds up in the blood instead of going into cells. As a result, high blood glucose (hyperglycemia) can develop, which can cause many complications. Hyperglycemia is a symptom of prediabetes. Having high blood glucose for a long time is dangerous. Too much glucose in your blood can damage your nerves and blood vessels. Long-term damage can lead to complications from diabetes, which may include:  Heart disease.  Stroke.  Blindness.  Kidney  disease.  Depression.  Poor circulation in the feet and legs, which could lead to surgical removal (amputation) in severe cases. What can increase my risk? Risk factors for prediabetes include:  Having a family member with type 2 diabetes.  Being overweight or obese.  Being older than age 30.  Being of American [place], African-American, Hispanic/Latino, or Asian/Pacific Islander descent.  Having an inactive (sedentary) lifestyle.  Having a history of heart disease.  History of gestational diabetes or polycystic ovary syndrome (PCOS), in women.  Having low levels of good cholesterol (HDL-C) or high levels of blood fats (triglycerides).  Having high blood pressure. What actions can I take to prevent diabetes?      Be physically active. ? Do moderate-intensity physical activity for 30 or more minutes on 5 or more days of the week, or as much as told by your health care provider. This could be brisk walking, biking, or water aerobics. ? Ask your health care provider what activities are safe for you. A mix of physical activities may be best, such as walking, swimming, cycling, and strength training.  Lose weight as told by your health care provider. ? Losing 5-7% of your body weight can reverse insulin resistance. ? Your health care provider can determine how much weight loss is best for you and can help you lose weight safely.  Follow a healthy meal plan. This includes eating lean proteins, complex carbohydrates, fresh fruits and vegetables, low-fat dairy products, and healthy fats. ? Follow instructions from your health care provider about eating or drinking restrictions. ? Make an appointment to see a diet and nutrition specialist (registered dietitian) to help you create a healthy eating plan that is right for you.  Do not smoke or use any tobacco products, such as cigarettes, chewing tobacco, and e-cigarettes. If you need help quitting, ask your health care provider.  Take  over-the-counter and prescription medicines as told by your health care provider. You may be prescribed medicines that help lower the risk of type 2 diabetes.  Keep all follow-up visits as told by your health care provider. This is important. Summary  Prediabetes is the condition of having a blood sugar (blood glucose) level that is higher than it should be, but not high enough for you to be diagnosed with type 2 diabetes.  Having prediabetes puts you at risk for developing type 2 diabetes (type 2 diabetes mellitus).  To help prevent type 2 diabetes, make lifestyle changes such as being physically active and eating a healthy diet. Lose weight as told by your health care provider. This information is not intended to replace advice given to you by your health care provider. Make sure you discuss any questions you have with your health care provider. Document Revised: 03/04/2019 Document Reviewed: 01/01/2016 Elsevier Patient Education  2020 [place].     Prediabetes Eating Plan Prediabetes is a condition that causes blood sugar (glucose) levels to be higher than normal. This increases the risk for developing diabetes. In order to prevent diabetes from developing,  your health care provider may recommend a diet and other lifestyle changes to help you:  Control your blood glucose levels.  Improve your cholesterol levels.  Manage your blood pressure. Your health care provider may recommend working with a diet and nutrition specialist (dietitian) to make a meal plan that is best for you. What are tips for following this plan? Lifestyle  Set weight loss goals with the help of your health care team. It is recommended that most people with prediabetes lose 7% of their current body weight.  Exercise for at least 30 minutes at least 5 days a week.  Attend a support group or seek ongoing support from a mental health counselor.  Take over-the-counter and prescription medicines only as told  by your health care provider. Reading food labels  Read food labels to check the amount of fat, salt (sodium), and sugar in prepackaged foods. Avoid foods that have: ? Saturated fats. ? Trans fats. ? Added sugars.  Avoid foods that have more than 300 milligrams (mg) of sodium per serving. Limit your daily sodium intake to less than 2,300 mg each day. Shopping  Avoid buying pre-made and processed foods. Cooking  Cook with olive oil. Do not use butter, lard, or ghee.  Bake, broil, grill, or boil foods. Avoid frying. Meal planning   Work with your dietitian to develop an eating plan that is right for you. This may include: ? Tracking how many calories you take in. Use a food diary, notebook, or mobile application to track what you eat at each meal. ? Using the glycemic index (GI) to plan your meals. The index tells you how quickly a food will raise your blood glucose. Choose low-GI foods. These foods take a longer time to raise blood glucose.  Consider following a Mediterranean diet. This diet includes: ? Several servings each day of fresh fruits and vegetables. ? Eating fish at least twice a week. ? Several servings each day of whole grains, beans, nuts, and seeds. ? Using olive oil instead of other fats. ? Moderate alcohol consumption. ? Eating small amounts of red meat and whole-fat dairy.  If you have high blood pressure, you may need to limit your sodium intake or follow a diet such as the DASH eating plan. DASH is an eating plan that aims to lower high blood pressure. What foods are recommended? The items listed below may not be a complete list. Talk with your dietitian about what dietary choices are best for you. Grains Whole grains, such as whole-wheat or whole-grain breads, crackers, cereals, and pasta. Unsweetened oatmeal. Bulgur. Barley. Quinoa. Brown rice. Corn or whole-wheat flour tortillas or taco shells. Vegetables Lettuce. Spinach. Peas. Beets. Cauliflower. Cabbage.  Broccoli. Carrots. Tomatoes. Squash. Eggplant. Herbs. Peppers. Onions. Cucumbers. Brussels sprouts. Fruits Berries. Bananas. Apples. Oranges. Grapes. Papaya. Mango. Pomegranate. Kiwi. Grapefruit. Cherries. Meats and other protein foods Seafood. Poultry without skin. Lean cuts of pork and beef. Tofu. Eggs. Nuts. Beans. Dairy Low-fat or fat-free dairy products, such as yogurt, cottage cheese, and cheese. Beverages Water. Tea. Coffee. Sugar-free or diet soda. Seltzer water. Lowfat or no-fat milk. Milk alternatives, such as soy or almond milk. Fats and oils Olive oil. Canola oil. Sunflower oil. Grapeseed oil. Avocado. Walnuts. Sweets and desserts Sugar-free or low-fat pudding. Sugar-free or low-fat ice cream and other frozen treats. Seasoning and other foods Herbs. Sodium-free spices. Mustard. Relish. Low-fat, low-sugar ketchup. Low-fat, low-sugar barbecue sauce. Low-fat or fat-free mayonnaise. What foods are not recommended? The items listed below may not  be a complete list. Talk with your dietitian about what dietary choices are best for you. Grains Refined white flour and flour products, such as bread, pasta, snack foods, and cereals. Vegetables Canned vegetables. Frozen vegetables with butter or cream sauce. Fruits Fruits canned with syrup. Meats and other protein foods Fatty cuts of meat. Poultry with skin. Breaded or fried meat. Processed meats. Dairy Full-fat yogurt, cheese, or milk. Beverages Sweetened drinks, such as sweet iced tea and soda. Fats and oils Butter. Lard. Ghee. Sweets and desserts Baked goods, such as cake, cupcakes, pastries, cookies, and cheesecake. Seasoning and other foods Spice mixes with added salt. Ketchup. Barbecue sauce. Mayonnaise. Summary  To prevent diabetes from developing, you may need to make diet and other lifestyle changes to help control blood sugar, improve cholesterol levels, and manage your blood pressure.  Set weight loss goals with the  help of your health care team. It is recommended that most people with prediabetes lose 7 percent of their current body weight.  Consider following a Mediterranean diet that includes plenty of fresh fruits and vegetables, whole grains, beans, nuts, seeds, fish, lean meat, low-fat dairy, and healthy oils. This information is not intended to replace advice given to you by your health care provider. Make sure you discuss any questions you have with your health care provider. Document Revised: 03/04/2019 Document Reviewed: 01/14/2017 Elsevier Patient Education  [place].   Influenza (Flu) Vaccine (Inactivated or Recombinant): What You Need to Know 1. Why get vaccinated? Influenza vaccine can prevent influenza (flu). Flu is a contagious disease that spreads around the [place] every year, usually between October and May. Anyone can get the flu, but it is more dangerous for some people. Infants and young children, people 40 years of age and older, pregnant women, and people with certain health conditions or a weakened immune system are at greatest risk of flu complications. Pneumonia, bronchitis, sinus infections and ear infections are examples of flu-related complications. If you have a medical condition, such as heart disease, cancer or diabetes, flu can make it worse. Flu can cause fever and chills, sore throat, muscle aches, fatigue, cough, headache, and runny or stuffy nose. Some people may have vomiting and diarrhea, though this is more common in children than adults. Each year thousands of people in the [place] die from flu, and many more are hospitalized. Flu vaccine prevents millions of illnesses and flu-related visits to the doctor each year. 2. Influenza vaccine CDC recommends everyone 77 months of age and older get vaccinated every flu season. Children 6 months through 68 years of age may need 2 doses during a single flu season. Everyone else needs only 1 dose each flu  season. It takes about 2 weeks for protection to develop after vaccination. There are many flu viruses, and they are always changing. Each year a new flu vaccine is made to protect against three or four viruses that are likely to cause disease in the upcoming flu season. Even when the vaccine doesn't exactly match these viruses, it may still provide some protection. Influenza vaccine does not cause flu. Influenza vaccine may be given at the same time as other vaccines. 3. Talk with your health care provider Tell your vaccine provider if the person getting the vaccine:  Has had an allergic reaction after a previous dose of influenza vaccine, or has any severe, life-threatening allergies.  Has ever had Guillain-Barr Syndrome (also called GBS). In some cases, your health care provider may decide to  postpone influenza vaccination to a future visit. People with minor illnesses, such as a cold, may be vaccinated. People who are moderately or severely ill should usually wait until they recover before getting influenza vaccine. Your health care provider can give you more information. 4. Risks of a vaccine reaction  Soreness, redness, and swelling where shot is given, fever, muscle aches, and headache can happen after influenza vaccine.  There may be a very small increased risk of Guillain-Barr Syndrome (GBS) after inactivated influenza vaccine (the flu shot). Young children who get the flu shot along with pneumococcal vaccine (PCV13), and/or DTaP vaccine at the same time might be slightly more likely to have a seizure caused by fever. Tell your health care provider if a child who is getting flu vaccine has ever had a seizure. People sometimes faint after medical procedures, including vaccination. Tell your provider if you feel dizzy or have vision changes or ringing in the ears. As with any medicine, there is a very remote chance of a vaccine causing a severe allergic reaction, other serious injury,  or death. 5. What if there is a serious problem? An allergic reaction could occur after the vaccinated person leaves the clinic. If you see signs of a severe allergic reaction (hives, swelling of the face and throat, difficulty breathing, a fast heartbeat, dizziness, or weakness), call 9-1-1 and get the person to the nearest hospital. For other signs that concern you, call your health care provider. Adverse reactions should be reported to the Vaccine Adverse Event Reporting System (VAERS). Your health care provider will usually file this report, or you can do it yourself. Visit the VAERS website at www.vaers.SamedayNews.es or call (873)299-3040.VAERS is only for reporting reactions, and VAERS staff do not give medical advice. 6. The National Vaccine Injury Compensation Program The [employer] Vaccine Injury Compensation Program (VICP) is a federal program that was created to compensate people who may have been injured by certain vaccines. Visit the VICP website at GoldCloset.com.ee or call 816-868-9640 to learn about the program and about filing a claim. There is a time limit to file a claim for compensation. 7. How can I learn more?  Ask your healthcare provider.  Call your local or state health department.  Contact the Centers for Disease Control and Prevention (CDC): ? Call (510)437-9343 (1-800-CDC-INFO) or ? Visit CDC's https://gibson.com/ Vaccine Information Statement (Interim) Inactivated Influenza Vaccine (07/08/2018) This information is not intended to replace advice given to you by your health care provider. Make sure you discuss any questions you have with your health care provider. Document Revised: 03/01/2019 Document Reviewed: 07/12/2018 Elsevier Patient Education  [place].

## 2020-07-24 NOTE — Assessment & Plan Note (Signed)
New diagnosis after last visit with A1c of 5.7 Worsening with A1c now 6.2 Discussed importance of regular exercise and low-carb diet Discussed carb portioning and counting in great detail Handout given about prediabetic diet plan Discussed possibility of considering Metformin, but patient declines at this time Discussed importance of preventing progression to diabetes Follow-up in 3 months and repeat A1c

## 2020-07-24 NOTE — Progress Notes (Signed)
Established patient visit   Patient: Julie Berger   DOB: [birth year]   54 y.o. Female  MRN: [id number] Visit Date: 07/24/2020  Today's healthcare provider: Lavon Paganini, MD  Cameron Ali as a scribe for Lavon Paganini, MD.,have documented all relevant documentation on the behalf of Lavon Paganini, MD,as directed by  Lavon Paganini, MD while in the presence of Lavon Paganini, MD. Chief Complaint  Patient presents with  . Follow-up   Subjective    HPI  Prediabetes, Follow-up  Lab Results  Component Value Date   HGBA1C 6.3 (A) 07/24/2020   HGBA1C 5.7 (H) 02/17/2020   GLUCOSE 92 02/17/2020   GLUCOSE 109 (H) 08/22/2019    Last seen for for this6 months ago.  Management since that visit includes none. Current symptoms include visual disturbances and have been stable.  Prior visit with dietician: no Current diet: well balanced Current exercise: none  Pertinent Labs:    Component Value Date/Time   CHOL 205 (H) 02/17/2020 1438   TRIG 137 02/17/2020 1438   CHOLHDL 4.3 02/17/2020 1438   CREATININE 1.01 (H) 02/17/2020 1438    Wt Readings from Last 3 Encounters:  07/24/20 180 lb 12.8 oz (82 kg)  02/17/20 190 lb (86.2 kg)  01/18/20 187 lb (84.8 kg)    -----------------------------------------------------------------------------------------  Patient Active Problem List   Diagnosis Date Noted  . Prediabetes 07/24/2020  . Other chest pain   . Other foreign object in esophagus causing other injury, initial encounter   . Encounter for long-term opiate analgesic use 01/18/2020  . Gastroesophageal reflux disease with esophagitis 11/03/2019  . Chronic pain 11/02/2019  . History of cervical cancer 11/02/2019  . Fibromyalgia 11/02/2019  . Obesity 11/02/2019  . Not currently working due to disabled status 11/02/2019  . MDD (major depressive disorder) 11/02/2019  . Complex regional pain syndrome I 11/02/2019  . Paroxysmal atrial  fibrillation ([place]) 11/02/2019  . 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 Atrial fibrillation ([place])   . Barrett esophagus   . Blood transfusion without reported diagnosis   . Cancer ([facility])    cervical  . Chronic pain   . Complex regional pain syndrome I   . 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 Smoking status: Former Smoker    Packs/day: 1.00    Years: 20.00    Pack years: 20.00    Types: Cigarettes    Quit date: 08/22/1999    Years since quitting: 20.9  . Smokeless tobacco: Never Used  Vaping Use  . Vaping Use: Never used  Substance Use Topics  . Alcohol use: Never  . Drug use: Never   Allergies  Allergen Reactions  . Nsaids Shortness Of Breath       Medications: Outpatient Medications Prior to Visit  Medication Sig  . naloxone (NARCAN) 4 MG/0.1ML LIQD nasal spray kit USE UTD FOR SUSPECTED OVERDOSE.  . pregabalin (LYRICA) 150 MG capsule Take 1 capsule (150 mg total) by mouth 3 (three) times daily.  Marland Kitchen XTAMPZA ER 9 MG C12A Take 1 capsule by mouth every 12 (twelve) hours.  . [DISCONTINUED] buPROPion (WELLBUTRIN XL) 150 MG 24 hr tablet Take 150 mg by mouth daily.  Marland Kitchen diltiazem (CARDIZEM CD) 120 MG 24 hr capsule Take by mouth.  . [DISCONTINUED] oxyCODONE-acetaminophen (PERCOCET) 10-325 MG tablet Take 1 tablet by mouth 4 (four) times daily as needed. (  Patient not taking: Reported on 02/17/2020)   No facility-administered medications prior to visit.    Review of Systems  Constitutional: Negative.   Respiratory: Negative.   Cardiovascular: Negative.   Endocrine: Negative.   Genitourinary: Negative.        Objective    BP 104/72   Pulse 76   Temp 98.2 F (36.8 C) (Oral)   Resp 16   Wt 180 lb 12.8 oz (82 kg)   BMI 31.03 kg/m     Physical Exam Vitals reviewed.  Constitutional:      General: She is not in acute distress.     Appearance: Normal appearance. She is not diaphoretic.  HENT:     Head: Normocephalic and atraumatic.  Eyes:     Conjunctiva/sclera: Conjunctivae normal.  Cardiovascular:     Rate and Rhythm: Normal rate and regular rhythm.     Pulses: Normal pulses.     Heart sounds: Normal heart sounds. No murmur heard.   Pulmonary:     Effort: Pulmonary effort is normal. No respiratory distress.     Breath sounds: Normal breath sounds. No wheezing.  Abdominal:     General: There is no distension.     Tenderness: There is no abdominal tenderness.  Musculoskeletal:     Cervical back: Neck supple.     Right lower leg: No edema.     Left lower leg: No edema.  Lymphadenopathy:     Cervical: No cervical adenopathy.  Skin:    General: Skin is warm and dry.     Findings: No rash.  Neurological:     Mental Status: She is alert and oriented to person, place, and time. Mental status is at baseline.  Psychiatric:        Mood and Affect: Mood normal.        Behavior: Behavior normal.     Results for orders placed or performed in visit on 07/24/20  POCT glycosylated hemoglobin (Hb A1C)  Result Value Ref Range   Hemoglobin A1C 6.3 (A) 4.0 - 5.6 %   HbA1c POC (<> result, manual entry)     HbA1c, POC (prediabetic range)     HbA1c, POC (controlled diabetic range)      Assessment & Plan     Problem List Items Addressed This Visit      Other   Obesity    Discussed importance of healthy weight management Discussed diet and exercise       Prediabetes - Primary    New diagnosis after last visit with A1c of 5.7 Worsening with A1c now 6.2 Discussed importance of regular exercise and low-carb diet Discussed carb portioning and counting in great detail Handout given about prediabetic diet plan Discussed possibility of considering Metformin, but patient declines at this time Discussed importance of preventing progression to diabetes Follow-up in 3 months and repeat A1c      Relevant Orders    POCT glycosylated hemoglobin (Hb A1C) (Completed)    Other Visit Diagnoses    Need for influenza vaccination       Relevant Orders   Flu Vaccine QUAD 36+ mos IM (Completed)       Return in about 3 months (around 10/23/2020) for chronic disease f/u.      I, Lavon Paganini, MD, have reviewed all documentation for this visit. The documentation on 07/25/20 for the exam, diagnosis, procedures, and orders are all accurate and complete.   Lajuanna Pompa, Dionne Bucy, MD, MPH [facility]

## 2020-07-25 NOTE — Assessment & Plan Note (Signed)
Discussed importance of healthy weight management Discussed diet and exercise  

## 2020-09-27 DIAGNOSIS — I480 Paroxysmal atrial fibrillation: Secondary | ICD-10-CM | POA: Diagnosis not present

## 2020-09-27 DIAGNOSIS — E6609 Other obesity due to excess calories: Secondary | ICD-10-CM | POA: Diagnosis not present

## 2020-09-27 DIAGNOSIS — Z6832 Body mass index (BMI) 32.0-32.9, adult: Secondary | ICD-10-CM | POA: Diagnosis not present

## 2020-10-19 ENCOUNTER — Other Ambulatory Visit: Payer: Self-pay | Admitting: Family Medicine

## 2020-10-19 IMAGING — DX DG CHEST 1V PORT
1 series · 1 of 1 positions shown · non-contrast
Comparison: None.

CLINICAL DATA: 53-year-old female with chest pain.

EXAM:
PORTABLE CHEST 1 VIEW

[chest ap]
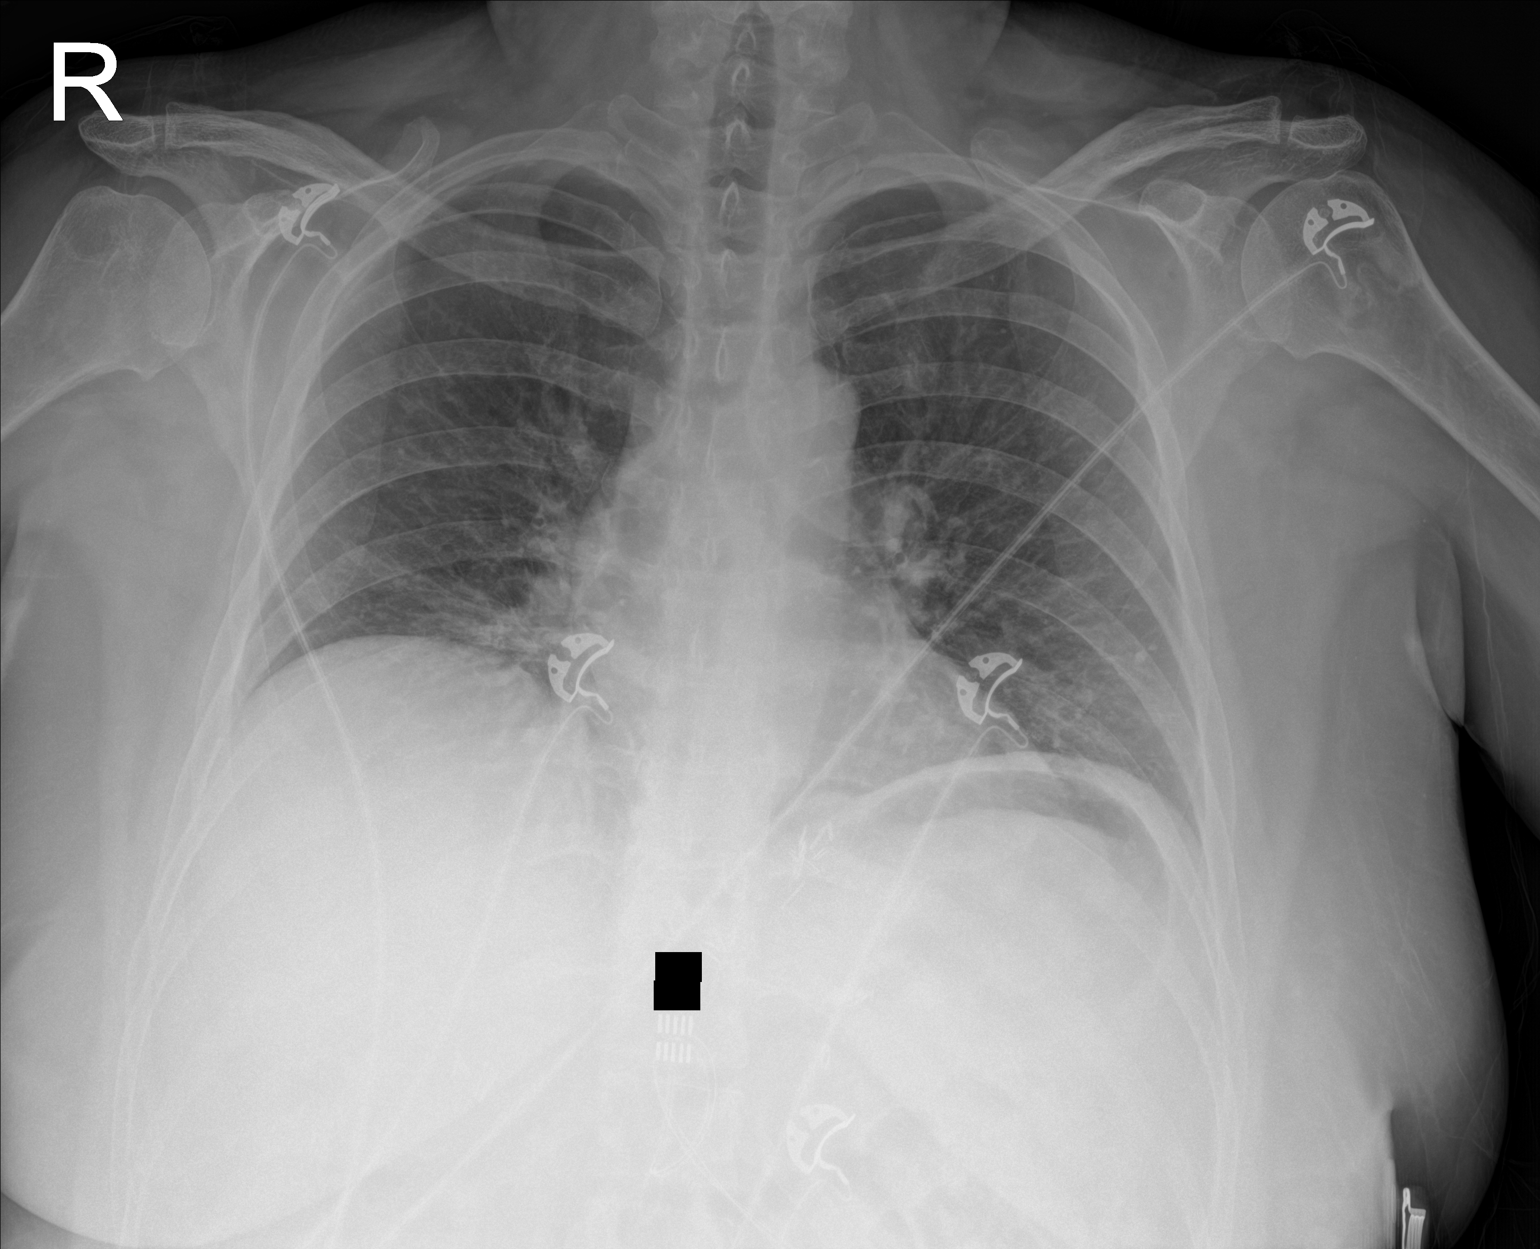

[1 of 1 positions shown; findings below may reference images not displayed]

FINDINGS: Portable AP view at 5455 hours. Normal cardiac size and mediastinal
contours. Visualized tracheal air column is within normal limits.

Low lung volumes with asymmetric and streaky right infrahilar
opacity. No pneumothorax, pulmonary edema, pleural effusion or other
confluent opacity.

Lower thoracic spinal stimulator device. Surgical clips at the
gastroesophageal junction. Gastric air suspected just below the left
hemidiaphragm. Right abdomen cholecystectomy clips. No acute osseous
abnormality identified.
IMPRESSION: 1. Low lung volumes with nonspecific right infrahilar opacity which
might simply be atelectasis. PA and lateral views of the chest would
be helpful when possible.
2. Postoperative changes in the upper abdomen and lower thoracic
spinal stimulator device.

## 2020-10-26 ENCOUNTER — Other Ambulatory Visit: Payer: Self-pay

## 2020-10-26 ENCOUNTER — Encounter: Payer: Self-pay | Admitting: Family Medicine

## 2020-10-26 ENCOUNTER — Ambulatory Visit (INDEPENDENT_AMBULATORY_CARE_PROVIDER_SITE_OTHER): Payer: Medicaid Other | Admitting: Family Medicine

## 2020-10-26 VITALS — BP 110/76 | HR 77 | Temp 98.6°F | Wt 177.0 lb

## 2020-10-26 DIAGNOSIS — F3341 Major depressive disorder, recurrent, in partial remission: Secondary | ICD-10-CM

## 2020-10-26 DIAGNOSIS — E782 Mixed hyperlipidemia: Secondary | ICD-10-CM | POA: Diagnosis not present

## 2020-10-26 DIAGNOSIS — E669 Obesity, unspecified: Secondary | ICD-10-CM | POA: Diagnosis not present

## 2020-10-26 DIAGNOSIS — K2100 Gastro-esophageal reflux disease with esophagitis, without bleeding: Secondary | ICD-10-CM | POA: Diagnosis not present

## 2020-10-26 DIAGNOSIS — I480 Paroxysmal atrial fibrillation: Secondary | ICD-10-CM

## 2020-10-26 DIAGNOSIS — Z6830 Body mass index (BMI) 30.0-30.9, adult: Secondary | ICD-10-CM

## 2020-10-26 DIAGNOSIS — R7303 Prediabetes: Secondary | ICD-10-CM | POA: Diagnosis not present

## 2020-10-26 MED ORDER — BUPROPION HCL ER (XL) 150 MG PO TB24: 150.0000 mg | ORAL_TABLET | Freq: Every day | ORAL | 3 refills | Status: DC

## 2020-10-26 NOTE — Progress Notes (Signed)
Established patient visit   Patient: Julie Berger   DOB: 06-Jul-1966   54 y.o. Female  MRN: [id number] Visit Date: 10/26/2020  Today's healthcare provider: Lavon Paganini, MD   Chief Complaint  Patient presents with  . Hyperglycemia  . Depression   Subjective    HPI   Prediabetes, Follow-up  Lab Results  Component Value Date   HGBA1C 6.3 (A) 07/24/2020   HGBA1C 5.7 (H) 02/17/2020   GLUCOSE 92 02/17/2020   GLUCOSE 109 (H) 08/22/2019    Last seen for for this3 months ago.  Management since that visit includes work on lifestyle changes. Current symptoms include none and have been stable.  Current diet: in general, a "healthy" diet   Current exercise: none  Pertinent Labs:    Component Value Date/Time   CHOL 205 (H) 02/17/2020 1438   TRIG 137 02/17/2020 1438   CHOLHDL 4.3 02/17/2020 1438   CREATININE 1.01 (H) 02/17/2020 1438    Wt Readings from Last 3 Encounters:  10/26/20 177 lb (80.3 kg)  07/24/20 180 lb 12.8 oz (82 kg)  02/17/20 190 lb (86.2 kg)    ----------------------------------------------------------------------------------------- Since August, world has been in a tail spin per patient.  Daughter was in car accident. Son got into bad behavior and he was kicked out of the house.    Patient Active Problem List   Diagnosis Date Noted  . Mixed hyperlipidemia 10/26/2020  . Prediabetes 07/24/2020  . Other chest pain   . Encounter for long-term opiate analgesic use 01/18/2020  . Gastroesophageal reflux disease with esophagitis 11/03/2019  . Chronic pain 11/02/2019  . History of cervical cancer 11/02/2019  . Fibromyalgia 11/02/2019  . Obesity 11/02/2019  . Not currently working due to disabled status 11/02/2019  . MDD (major depressive disorder) 11/02/2019  . Complex regional pain syndrome I 11/02/2019  . Paroxysmal atrial fibrillation ([place]) 11/02/2019  . 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 Atrial fibrillation ([place])   . Barrett esophagus   . Blood transfusion without reported diagnosis   . Cancer (HCC)    cervical  . Chronic pain   . Complex regional pain syndrome I   . 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 Other foreign object in esophagus causing other injury, initial encounter    Social History   Tobacco Use  . Smoking status: Former Smoker    Packs/day: 1.00    Years: 20.00    Pack years: 20.00    Types: Cigarettes    Quit date: 08/22/1999    Years since quitting: 21.1  . Smokeless tobacco: Never Used  Vaping Use  . Vaping Use: Never used  Substance Use Topics  . Alcohol use: Never  . Drug use: Never   Allergies  Allergen Reactions  . Nsaids Shortness Of Breath     Medications: Outpatient Medications Prior to Visit  Medication Sig  . naloxone (NARCAN) 4 MG/0.1ML LIQD nasal spray kit USE UTD FOR SUSPECTED OVERDOSE.  Marland Kitchen oxyCODONE ER (XTAMPZA ER) 9 MG C12A Take by mouth 3 (three) times daily.   . pregabalin (LYRICA) 150 MG capsule Take 1 capsule (150 mg total) by mouth 3 (three) times daily.  . [DISCONTINUED] buPROPion (WELLBUTRIN XL) 150 MG 24 hr tablet TAKE 1 TABLET(150 MG) BY MOUTH DAILY  . diltiazem (CARDIZEM CD) 120 MG 24 hr capsule Take by mouth.  Ginger Organ ER 9 MG C12A Take 1  capsule by mouth every 12 (twelve) hours.   No facility-administered medications prior to visit.    Review of Systems  Constitutional: Negative.   Respiratory: Negative.   Cardiovascular: Negative.   Gastrointestinal: Negative.   Endocrine: Negative.   Neurological: Negative for dizziness, light-headedness and headaches.      Objective    BP 110/76 (BP Location: Right Arm, Patient Position: Sitting, Cuff Size: Large)   Pulse 77   Temp 98.6 F (37 C) (Oral)   Wt 177 lb (80.3 kg)   BMI 30.38 kg/m    Physical Exam Constitutional:      General: She is not in acute distress.    Appearance: Normal  appearance. She is not diaphoretic.  HENT:     Head: Normocephalic and atraumatic.  Eyes:     Conjunctiva/sclera: Conjunctivae normal.  Cardiovascular:     Rate and Rhythm: Normal rate and regular rhythm.     Heart sounds: Normal heart sounds. No murmur heard.   Pulmonary:     Effort: Pulmonary effort is normal. No respiratory distress.     Breath sounds: No wheezing.  Musculoskeletal:     Cervical back: Neck supple.     Right lower leg: No edema.     Left lower leg: No edema.  Lymphadenopathy:     Cervical: No cervical adenopathy.  Neurological:     Mental Status: She is alert and oriented to person, place, and time. Mental status is at baseline.  Psychiatric:        Mood and Affect: Mood normal.        Behavior: Behavior normal.       No results found for any visits on 10/26/20.  Assessment & Plan     Problem List Items Addressed This Visit      Cardiovascular and Mediastinum   Paroxysmal atrial fibrillation (HCC)    Currently in NSR Followed by Cardiology Continue diltiazem for rate control Not on anticoag        Digestive   Gastroesophageal reflux disease with esophagitis    Saw GI and had EGD A T -fasener were found in the esophagus. Removal was successful. Normal stomach and esophagus        Other   Obesity    Discussed importance of healthy weight management Discussed diet and exercise       Relevant Orders   Comprehensive metabolic panel   Lipid panel   Hemoglobin A1c   MDD (major depressive disorder)    Followed by psych but looking for new psychiatrist Continue current medications Will fill wellbutrin Has had a rough few months, but stable Contracted for safety - no SI/HI      Relevant Medications   buPROPion (WELLBUTRIN XL) 150 MG 24 hr tablet   Prediabetes - Primary    Encourage low carb diet Recheck A1c      Relevant Orders   Hemoglobin A1c   Mixed hyperlipidemia    Not on statin Recheck CMP and FLP and calculate ASCVD risk       Relevant Orders   Comprehensive metabolic panel   Lipid panel      Return in about 4 months (around 02/24/2021) for CPE, as scheduled.      I, Lavon Paganini, MD, have reviewed all documentation for this visit. The documentation on 10/26/20 for the exam, diagnosis, procedures, and orders are all accurate and complete.   Jerol Rufener, Dionne Bucy, MD, MPH [facility]

## 2020-10-26 NOTE — Assessment & Plan Note (Signed)
Currently in NSR Followed by Cardiology Continue diltiazem for rate control Not on anticoag

## 2020-10-26 NOTE — Assessment & Plan Note (Signed)
Not on statin Recheck CMP and FLP and calculate ASCVD risk

## 2020-10-26 NOTE — Assessment & Plan Note (Addendum)
Followed by psych but looking for new psychiatrist Continue current medications Will fill wellbutrin Has had a rough few months, but stable Contracted for safety - no SI/HI

## 2020-10-26 NOTE — Assessment & Plan Note (Signed)
Discussed importance of healthy weight management Discussed diet and exercise  

## 2020-10-26 NOTE — Assessment & Plan Note (Signed)
Encourage low carb diet Recheck A1c 

## 2020-10-26 NOTE — Assessment & Plan Note (Signed)
Saw GI and had EGD A T -fasener were found in the esophagus. Removal was successful. Normal stomach and esophagus

## 2020-11-13 DIAGNOSIS — R7303 Prediabetes: Secondary | ICD-10-CM | POA: Diagnosis not present

## 2020-11-13 DIAGNOSIS — Z6830 Body mass index (BMI) 30.0-30.9, adult: Secondary | ICD-10-CM | POA: Diagnosis not present

## 2020-11-13 DIAGNOSIS — E782 Mixed hyperlipidemia: Secondary | ICD-10-CM | POA: Diagnosis not present

## 2020-11-13 DIAGNOSIS — E669 Obesity, unspecified: Secondary | ICD-10-CM | POA: Diagnosis not present

## 2020-11-14 LAB — LIPID PANEL
Chol/HDL Ratio: 5.3 ratio — ABNORMAL HIGH (ref 0.0–4.4)
Cholesterol, Total: 234 mg/dL — ABNORMAL HIGH (ref 100–199)
HDL: 44 mg/dL (ref >39)
LDL Chol Calc (NIH): 162 mg/dL — ABNORMAL HIGH (ref 0–99)
Triglycerides: 154 mg/dL — ABNORMAL HIGH (ref 0–149)
VLDL Cholesterol Cal: 28 mg/dL (ref 5–40)

## 2020-11-14 LAB — HEMOGLOBIN A1C
Est. average glucose Bld gHb Est-mCnc: 117 mg/dL
Hgb A1c MFr Bld: 5.7 % — ABNORMAL HIGH (ref 4.8–5.6)

## 2020-11-14 LAB — COMPREHENSIVE METABOLIC PANEL
ALT: 14 IU/L (ref 0–32)
AST: 22 IU/L (ref 0–40)
Albumin/Globulin Ratio: 1.5 (ref 1.2–2.2)
Albumin: 4.5 g/dL (ref 3.8–4.9)
Alkaline Phosphatase: 97 IU/L (ref 44–121)
BUN/Creatinine Ratio: 9 (ref 9–23)
BUN: 9 mg/dL (ref 6–24)
Bilirubin Total: 0.3 mg/dL (ref 0.0–1.2)
CO2: 24 mmol/L (ref 20–29)
Calcium: 9.3 mg/dL (ref 8.7–10.2)
Chloride: 101 mmol/L (ref 96–106)
Creatinine, Ser: 1.01 mg/dL — ABNORMAL HIGH (ref 0.57–1.00)
GFR calc Af Amer: 72 mL/min/{1.73_m2} (ref >59)
GFR calc non Af Amer: 62 mL/min/{1.73_m2} (ref >59)
Globulin, Total: 3 g/dL (ref 1.5–4.5)
Glucose: 104 mg/dL — ABNORMAL HIGH (ref 65–99)
Potassium: 4.4 mmol/L (ref 3.5–5.2)
Sodium: 138 mmol/L (ref 134–144)
Total Protein: 7.5 g/dL (ref 6.0–8.5)

## 2021-02-05 ENCOUNTER — Telehealth: Payer: Self-pay

## 2021-02-05 NOTE — Telephone Encounter (Signed)
LMTCB patient needs to be reschedule for another day. Provider not in office 03/28

## 2021-02-18 ENCOUNTER — Encounter: Payer: Medicaid Other | Admitting: Family Medicine

## 2021-02-19 ENCOUNTER — Other Ambulatory Visit: Payer: Self-pay

## 2021-02-19 ENCOUNTER — Ambulatory Visit (INDEPENDENT_AMBULATORY_CARE_PROVIDER_SITE_OTHER): Payer: Medicaid Other | Admitting: Family Medicine

## 2021-02-19 ENCOUNTER — Encounter: Payer: Self-pay | Admitting: Family Medicine

## 2021-02-19 VITALS — BP 97/68 | HR 73 | Temp 98.5°F | Resp 16 | Ht 64.0 in | Wt 182.0 lb

## 2021-02-19 DIAGNOSIS — Z6831 Body mass index (BMI) 31.0-31.9, adult: Secondary | ICD-10-CM

## 2021-02-19 DIAGNOSIS — E669 Obesity, unspecified: Secondary | ICD-10-CM | POA: Diagnosis not present

## 2021-02-19 DIAGNOSIS — Z0000 Encounter for general adult medical examination without abnormal findings: Secondary | ICD-10-CM | POA: Diagnosis not present

## 2021-02-19 DIAGNOSIS — F3341 Major depressive disorder, recurrent, in partial remission: Secondary | ICD-10-CM | POA: Diagnosis not present

## 2021-02-19 DIAGNOSIS — Z1231 Encounter for screening mammogram for malignant neoplasm of breast: Secondary | ICD-10-CM

## 2021-02-19 DIAGNOSIS — I480 Paroxysmal atrial fibrillation: Secondary | ICD-10-CM | POA: Diagnosis not present

## 2021-02-19 MED ORDER — BUPROPION HCL ER (XL) 150 MG PO TB24: 150.0000 mg | ORAL_TABLET | Freq: Every day | ORAL | 3 refills | Status: DC

## 2021-02-19 NOTE — Progress Notes (Signed)
Complete physical exam   Patient: Julie Berger   DOB: Apr 16, 1966   55 y.o. Female  MRN: [id number] Visit Date: 02/19/2021  Today's healthcare provider: Lavon Paganini, MD   Chief Complaint  Patient presents with  . Annual Exam   Subjective    Julie Berger is a 55 y.o. female who presents today for a complete physical exam.  She reports consuming a general diet. Gym/ health club routine includes swimming. She generally feels fairly well. She reports sleeping poorly. She does not have additional problems to discuss today.   HPI  12/29/2019 Cologuard-negative  Past Medical History:  Diagnosis Date  . Atrial fibrillation ([place])   . Barrett esophagus   . Blood transfusion without reported diagnosis   . Cancer (HCC)    cervical  . Chronic pain   . Complex regional pain syndrome I   . 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 Other foreign object in esophagus causing other injury, initial encounter    Past Surgical History:  Procedure Laterality Date  . BACK SURGERY     x 3  . CHOLECYSTECTOMY    . ESOPHAGOGASTRODUODENOSCOPY (EGD) WITH PROPOFOL N/A 02/02/2020   Procedure: ESOPHAGOGASTRODUODENOSCOPY (EGD) WITH PROPOFOL;  Surgeon: Lucilla Lame, MD;  Location: [facility] ENDOSCOPY;  Service: Endoscopy;  Laterality: N/A;  . HIATAL HERNIA REPAIR    . HYSTERECTOMY ABDOMINAL WITH SALPINGO-OOPHORECTOMY    . KNEE ARTHROSCOPY Left   . SPINAL CORD STIMULATOR IMPLANT    . TONSILLECTOMY    . TRANSORAL INCISIONLESS FUNDOPLICATION    . ULNAR NERVE TRANSPOSITION Left    Social History   Socioeconomic History  . Marital status: Divorced    Spouse name: Not on file  . Number of children: 6  . Years of education: Not on file  . Highest education level: Not on file  Occupational History  . Occupation: Disabiled  Tobacco Use  . Smoking status: Former Smoker    Packs/day: 1.00    Years: 20.00    Pack years: 20.00    Types: Cigarettes    Quit date:  08/22/1999    Years since quitting: 21.5  . Smokeless tobacco: Never Used  Vaping Use  . Vaping Use: Never used  Substance and Sexual Activity  . Alcohol use: Never  . Drug use: Never  . Sexual activity: Never  Other Topics Concern  . Not on file  Social History Narrative  . Not on file   Social Determinants of Health   Financial Resource Strain: Not on file  Food Insecurity: Not on file  Transportation Needs: Not on file  Physical Activity: Not on file  Stress: Not on file  Social Connections: Not on file  Intimate Partner Violence: Not on file   Family Status  Relation Name Status  . Father  Alive  . Mother  Alive  . Brother  Alive  . Sister  Deceased  . Daughter  Alive  . Son  Alive  . Brother  Deceased  . Brother  Alive  . Daughter  Alive  . Son  Alive  . Son  Alive  . Son  Alive  . Neg Hx  (Not Specified)   Family History  Problem Relation Age of Onset  . Heart attack Father   . Mitral valve prolapse Father   . Hypertension Father   . Dementia Mother   . Lupus Mother   . Fibromyalgia Mother   . Diabetes Brother   . Cancer  Brother        unknown type  . Bone cancer Brother   . Colon cancer Neg Hx   . Breast cancer Neg Hx    Allergies  Allergen Reactions  . Nsaids Shortness Of Breath    Patient Care Team: Virginia Crews, MD as PCP - General (Family Medicine)   Medications: Outpatient Medications Prior to Visit  Medication Sig  . diltiazem (CARDIZEM CD) 120 MG 24 hr capsule Take 120 mg by mouth daily.  Marland Kitchen HM LIDOCAINE PATCH EX Apply 1 patch topically daily.  . naloxone (NARCAN) 4 MG/0.1ML LIQD nasal spray kit USE UTD FOR SUSPECTED OVERDOSE.  Marland Kitchen oxyCODONE ER (XTAMPZA ER) 9 MG C12A Take by mouth 3 (three) times daily.   . pregabalin (LYRICA) 150 MG capsule Take 1 capsule (150 mg total) by mouth 3 (three) times daily.  . [DISCONTINUED] buPROPion (WELLBUTRIN XL) 150 MG 24 hr tablet Take 1 tablet (150 mg total) by mouth daily.  . [DISCONTINUED]  XTAMPZA ER 9 MG C12A Take 1 capsule by mouth every 12 (twelve) hours. (Patient not taking: Reported on 02/19/2021)   No facility-administered medications prior to visit.    Review of Systems  Constitutional: Positive for fatigue.  HENT: Positive for dental problem.   Eyes: Positive for itching.  Respiratory: Negative.   Cardiovascular: Negative.   Gastrointestinal: Negative.   Endocrine: Positive for polydipsia.  Genitourinary: Negative.   Musculoskeletal: Positive for arthralgias and back pain.  Skin: Negative.   Allergic/Immunologic: Positive for environmental allergies.  Neurological: Negative.   Hematological: Negative.   Psychiatric/Behavioral: Positive for sleep disturbance.    Last CBC Lab Results  Component Value Date   WBC 8.8 08/22/2019   HGB 12.6 08/22/2019   HCT 39.5 08/22/2019   MCV 83.9 08/22/2019   MCH 26.8 08/22/2019   RDW 13.5 08/22/2019   PLT 156 69/45/0388   Last metabolic panel Lab Results  Component Value Date   GLUCOSE 104 (H) 11/13/2020   NA 138 11/13/2020   K 4.4 11/13/2020   CL 101 11/13/2020   CO2 24 11/13/2020   BUN 9 11/13/2020   CREATININE 1.01 (H) 11/13/2020   GFRNONAA 62 11/13/2020   GFRAA 72 11/13/2020   CALCIUM 9.3 11/13/2020   PROT 7.5 11/13/2020   ALBUMIN 4.5 11/13/2020   LABGLOB 3.0 11/13/2020   AGRATIO 1.5 11/13/2020   BILITOT 0.3 11/13/2020   ALKPHOS 97 11/13/2020   AST 22 11/13/2020   ALT 14 11/13/2020   ANIONGAP 8 08/22/2019   Last lipids Lab Results  Component Value Date   CHOL 234 (H) 11/13/2020   HDL 44 11/13/2020   LDLCALC 162 (H) 11/13/2020   TRIG 154 (H) 11/13/2020   CHOLHDL 5.3 (H) 11/13/2020   Last hemoglobin A1c Lab Results  Component Value Date   HGBA1C 5.7 (H) 11/13/2020   Last vitamin D Lab Results  Component Value Date   VD25OH 30 03/16/2018     Objective    BP 97/68 (BP Location: Left Arm, Patient Position: Sitting, Cuff Size: Large)   Pulse 73   Temp 98.5 F (36.9 C) (Oral)   Resp 16    Ht 5' 4" (1.626 m)   Wt 182 lb (82.6 kg)   BMI 31.24 kg/m  BP Readings from Last 3 Encounters:  02/19/21 97/68  10/26/20 110/76  07/24/20 104/72   Wt Readings from Last 3 Encounters:  02/19/21 182 lb (82.6 kg)  10/26/20 177 lb (80.3 kg)  07/24/20 180 lb 12.8 oz (82  kg)      Physical Exam Vitals reviewed.  Constitutional:      General: She is not in acute distress.    Appearance: Normal appearance. She is well-developed. She is not diaphoretic.  HENT:     Head: Normocephalic and atraumatic.     Right Ear: Tympanic membrane, ear canal and external ear normal.     Left Ear: Tympanic membrane, ear canal and external ear normal.  Eyes:     General: No scleral icterus.    Conjunctiva/sclera: Conjunctivae normal.     Pupils: Pupils are equal, round, and reactive to light.  Neck:     Thyroid: No thyromegaly.  Cardiovascular:     Rate and Rhythm: Normal rate and regular rhythm.     Pulses: Normal pulses.     Heart sounds: Normal heart sounds. No murmur heard.   Pulmonary:     Effort: Pulmonary effort is normal. No respiratory distress.     Breath sounds: Normal breath sounds. No wheezing or rales.  Abdominal:     General: There is no distension.     Palpations: Abdomen is soft.     Tenderness: There is no abdominal tenderness.  Musculoskeletal:     Cervical back: Neck supple.     Right lower leg: No edema.     Left lower leg: No edema.  Lymphadenopathy:     Cervical: No cervical adenopathy.  Skin:    General: Skin is warm and dry.     Findings: No rash.  Neurological:     Mental Status: She is alert and oriented to person, place, and time. Mental status is at baseline.  Psychiatric:        Mood and Affect: Mood normal.        Behavior: Behavior normal.       Last depression screening scores PHQ 2/9 Scores 02/19/2021 10/26/2020 02/17/2020  PHQ - 2 Score _0 PHQ- 9 Score _1 Last fall risk screening Fall Risk  02/19/2021  Falls in the past year? 0   Number falls in past yr: 0  Injury with Fall? 0  Risk for fall due to : No Fall Risks  Follow up Falls evaluation completed   Last Audit-C alcohol use screening Alcohol Use Disorder Test (AUDIT) 02/19/2021  1. How often do you have a drink containing alcohol? 0  2. How many drinks containing alcohol do you have on a typical day when you are drinking? 0  3. How often do you have six or more drinks on one occasion? 0  AUDIT-C Score 0  Alcohol Brief Interventions/Follow-up AUDIT Score <7 follow-up not indicated   A score of 3 or more in women, and 4 or more in men indicates increased risk for alcohol abuse, EXCEPT if all of the points are from question 1   No results found for any visits on 02/19/21.  Assessment & Plan    Routine Health Maintenance and Physical Exam  Exercise Activities and Dietary recommendations Goals   None     Immunization History  Administered Date(s) Administered  . Influenza,inj,Quad PF,6+ Mos 11/11/2019, 07/24/2020  . Moderna Sars-Covid-2 Vaccination 03/17/2020, 04/14/2020  . Tdap 02/17/2020  . Zoster Recombinat (Shingrix) 02/17/2020    Health Maintenance  Topic Date Due  . MAMMOGRAM  12/17/2019  . COVID-19 Vaccine (3 - Booster for Moderna series) 10/15/2020  . Fecal DNA (Cologuard)  12/28/2022  . TETANUS/TDAP  02/16/2030  . INFLUENZA VACCINE  Completed  .  Hepatitis C Screening  Completed  . HIV Screening  Completed  . HPV VACCINES  Aged Out    Discussed health benefits of physical activity, and encouraged her to engage in regular exercise appropriate for her age and condition.  Problem List Items Addressed This Visit      Cardiovascular and Mediastinum   Paroxysmal atrial fibrillation (HCC)    Currently in NSR Followed by Cardiology Continue Diltiazem for rate control Not on anticoag        Other   Obesity    Discussed importance of healthy weight management Discussed diet and exercise       MDD (major depressive disorder)     Chronic and well controlled Continue wellbutrin at current dose      Relevant Medications   buPROPion (WELLBUTRIN XL) 150 MG 24 hr tablet    Other Visit Diagnoses    Encounter for annual physical exam    -  Primary   Breast cancer screening by mammogram       Relevant Orders   MM 3D SCREEN BREAST BILATERAL       Return in about 6 months (around 08/22/2021) for chronic disease f/u.     I, Lavon Paganini, MD, have reviewed all documentation for this visit. The documentation on 02/19/21 for the exam, diagnosis, procedures, and orders are all accurate and complete.   Bac[place]al[place], Dionne Bucy, MD, MPH [facility]

## 2021-02-19 NOTE — Assessment & Plan Note (Signed)
Chronic and well controlled Continue wellbutrin at current dose

## 2021-02-19 NOTE — Assessment & Plan Note (Signed)
Currently in NSR Followed by Cardiology Continue Diltiazem for rate control Not on anticoag

## 2021-02-19 NOTE — Patient Instructions (Signed)
Preventive Care 57-57 Years Old, Female Preventive care refers to lifestyle choices and visits with your health care provider that can promote health and wellness. This includes:  A yearly physical exam. This is also called an annual wellness visit.  Regular dental and eye exams.  Immunizations.  Screening for certain conditions.  Healthy lifestyle choices, such as: ? Eating a healthy diet. ? Getting regular exercise. ? Not using drugs or products that contain nicotine and tobacco. ? Limiting alcohol use. What can I expect for my preventive care visit? Physical exam Your health care provider will check your:  Height and weight. These may be used to calculate your BMI (body mass index). BMI is a measurement that tells if you are at a healthy weight.  Heart rate and blood pressure.  Body temperature.  Skin for abnormal spots. Counseling Your health care provider may ask you questions about your:  Past medical problems.  Family's medical history.  Alcohol, tobacco, and drug use.  Emotional well-being.  Home life and relationship well-being.  Sexual activity.  Diet, exercise, and sleep habits.  Work and work Statistician.  Access to firearms.  Method of birth control.  Menstrual cycle.  Pregnancy history. What immunizations do I need? Vaccines are usually given at various ages, according to a schedule. Your health care provider will recommend vaccines for you based on your age, medical history, and lifestyle or other factors, such as travel or where you work.   What tests do I need? Blood tests  Lipid and cholesterol levels. These may be checked every 5 years, or more often if you are over 3 years old.  Hepatitis C test.  Hepatitis B test. Screening  Lung cancer screening. You may have this screening every year starting at age 57 if you have a 30-pack-year history of smoking and currently smoke or have quit within the past 15 years.  Colorectal cancer  screening. ? All adults should have this screening starting at age 57 and continuing until age 17. ? Your health care provider may recommend screening at age 49 if you are at increased risk. ? You will have tests every 1-10 years, depending on your results and the type of screening test.  Diabetes screening. ? This is done by checking your blood sugar (glucose) after you have not eaten for a while (fasting). ? You may have this done every 1-3 years.  Mammogram. ? This may be done every 1-2 years. ? Talk with your health care provider about when you should start having regular mammograms. This may depend on whether you have a family history of breast cancer.  BRCA-related cancer screening. This may be done if you have a family history of breast, ovarian, tubal, or peritoneal cancers.  Pelvic exam and Pap test. ? This may be done every 3 years starting at age 57. ? Starting at age 57, this may be done every 5 years if you have a Pap test in combination with an HPV test. Other tests  STD (sexually transmitted disease) testing, if you are at risk.  Bone density scan. This is done to screen for osteoporosis. You may have this scan if you are at high risk for osteoporosis. Talk with your health care provider about your test results, treatment options, and if necessary, the need for more tests. Follow these instructions at home: Eating and drinking  Eat a diet that includes fresh fruits and vegetables, whole grains, lean protein, and low-fat dairy products.  Take vitamin and mineral supplements  as recommended by your health care provider.  Do not drink alcohol if: ? Your health care provider tells you not to drink. ? You are pregnant, may be pregnant, or are planning to become pregnant.  If you drink alcohol: ? Limit how much you have to 0-1 drink a day. ? Be aware of how much alcohol is in your drink. In the [place] one drink equals one 12 oz bottle of beer (355 mL), one 5 oz glass of  wine (148 mL), or one 1 oz glass of hard liquor (44 mL).   Lifestyle  Take daily care of your teeth and gums. Brush your teeth every morning and night with fluoride toothpaste. Floss one time each day.  Stay active. Exercise for at least 30 minutes 5 or more days each week.  Do not use any products that contain nicotine or tobacco, such as cigarettes, e-cigarettes, and chewing tobacco. If you need help quitting, ask your health care provider.  Do not use drugs.  If you are sexually active, practice safe sex. Use a condom or other form of protection to prevent STIs (sexually transmitted infections).  If you do not wish to become pregnant, use a form of birth control. If you plan to become pregnant, see your health care provider for a prepregnancy visit.  If told by your health care provider, take low-dose aspirin daily starting at age 57.  Find healthy ways to cope with stress, such as: ? Meditation, yoga, or listening to music. ? Journaling. ? Talking to a trusted person. ? Spending time with friends and family. Safety  Always wear your seat belt while driving or riding in a vehicle.  Do not drive: ? If you have been drinking alcohol. Do not ride with someone who has been drinking. ? When you are tired or distracted. ? While texting.  Wear a helmet and other protective equipment during sports activities.  If you have firearms in your house, make sure you follow all gun safety procedures. What's next?  Visit your health care provider once a year for an annual wellness visit.  Ask your health care provider how often you should have your eyes and teeth checked.  Stay up to date on all vaccines. This information is not intended to replace advice given to you by your health care provider. Make sure you discuss any questions you have with your health care provider. Document Revised: 08/14/2020 Document Reviewed: 07/22/2018 Elsevier Patient Education  2021 Elsevier Inc.  

## 2021-02-19 NOTE — Assessment & Plan Note (Signed)
Discussed importance of healthy weight management Discussed diet and exercise  

## 2021-07-10 DIAGNOSIS — M797 Fibromyalgia: Secondary | ICD-10-CM | POA: Diagnosis not present

## 2021-07-10 DIAGNOSIS — G90529 Complex regional pain syndrome I of unspecified lower limb: Secondary | ICD-10-CM | POA: Diagnosis not present

## 2021-07-10 DIAGNOSIS — G894 Chronic pain syndrome: Secondary | ICD-10-CM | POA: Diagnosis not present

## 2021-07-10 DIAGNOSIS — Z79899 Other long term (current) drug therapy: Secondary | ICD-10-CM | POA: Diagnosis not present

## 2021-07-10 DIAGNOSIS — Z79891 Long term (current) use of opiate analgesic: Secondary | ICD-10-CM | POA: Diagnosis not present

## 2021-08-05 DIAGNOSIS — Z79899 Other long term (current) drug therapy: Secondary | ICD-10-CM | POA: Diagnosis not present

## 2021-08-05 DIAGNOSIS — M797 Fibromyalgia: Secondary | ICD-10-CM | POA: Diagnosis not present

## 2021-08-05 DIAGNOSIS — Z79891 Long term (current) use of opiate analgesic: Secondary | ICD-10-CM | POA: Diagnosis not present

## 2021-08-05 DIAGNOSIS — G90529 Complex regional pain syndrome I of unspecified lower limb: Secondary | ICD-10-CM | POA: Diagnosis not present

## 2021-08-05 DIAGNOSIS — G894 Chronic pain syndrome: Secondary | ICD-10-CM | POA: Diagnosis not present

## 2021-08-27 ENCOUNTER — Ambulatory Visit: Payer: Self-pay | Admitting: Family Medicine

## 2021-09-06 ENCOUNTER — Encounter: Payer: Self-pay | Admitting: Family Medicine

## 2021-09-06 ENCOUNTER — Ambulatory Visit: Payer: Medicaid Other | Admitting: Family Medicine

## 2021-09-06 ENCOUNTER — Other Ambulatory Visit: Payer: Self-pay

## 2021-09-06 VITALS — BP 105/81 | HR 77 | Temp 98.3°F | Ht 64.0 in | Wt 186.7 lb

## 2021-09-06 DIAGNOSIS — I480 Paroxysmal atrial fibrillation: Secondary | ICD-10-CM | POA: Diagnosis not present

## 2021-09-06 DIAGNOSIS — G90523 Complex regional pain syndrome I of lower limb, bilateral: Secondary | ICD-10-CM | POA: Diagnosis not present

## 2021-09-06 DIAGNOSIS — Z6830 Body mass index (BMI) 30.0-30.9, adult: Secondary | ICD-10-CM

## 2021-09-06 DIAGNOSIS — Z23 Encounter for immunization: Secondary | ICD-10-CM

## 2021-09-06 DIAGNOSIS — F3341 Major depressive disorder, recurrent, in partial remission: Secondary | ICD-10-CM | POA: Diagnosis not present

## 2021-09-06 DIAGNOSIS — R7303 Prediabetes: Secondary | ICD-10-CM

## 2021-09-06 DIAGNOSIS — E782 Mixed hyperlipidemia: Secondary | ICD-10-CM

## 2021-09-06 DIAGNOSIS — E669 Obesity, unspecified: Secondary | ICD-10-CM | POA: Diagnosis not present

## 2021-09-06 MED ORDER — BUPROPION HCL ER (XL) 300 MG PO TB24: 300.0000 mg | ORAL_TABLET | Freq: Every day | ORAL | 1 refills | Status: DC

## 2021-09-06 NOTE — Assessment & Plan Note (Signed)
Reviewed last lipid panel Not currently on a statin Recheck FLP and CMP Discussed diet and exercise  

## 2021-09-06 NOTE — Assessment & Plan Note (Signed)
Currently in NSR F/b cardiology Continue Diltiazem for rate control Not on anticoag

## 2021-09-06 NOTE — Assessment & Plan Note (Signed)
Discussed importance of healthy weight management Discussed diet and exercise  

## 2021-09-06 NOTE — Assessment & Plan Note (Addendum)
SCS in place F/b pain management - they manage all of her medications (Oxy and lyrica)

## 2021-09-06 NOTE — Assessment & Plan Note (Signed)
Chronic and uncontrolled with long depressive episode recently related to disability Will increase Wellbutrin to 300mg  daily XL

## 2021-09-06 NOTE — Assessment & Plan Note (Signed)
Recommend low carb diet °Recheck A1c  °

## 2021-09-06 NOTE — Progress Notes (Signed)
Established patient visit   Patient: Julie Berger   DOB: 1966/04/15   55 y.o. Female  MRN: [id number] Visit Date: 09/06/2021  Today's healthcare provider: Lavon Paganini, MD   Chief Complaint  Patient presents with   Depression   Hyperlipidemia   Hyperglycemia   Subjective    HPI  -Discussed wnating to start support group online for her disabilities. -would like to see if wellbutrin dosage can be increased. Feels she has built a tolerance for it. Has had a recent depressed mood due to not being able to exercise in pool as normal and meds did not assist. Felt like mood lasted longer than normal. -Aware she needs to get mammogram   Depression, Follow-up  She  was last seen for this 7 months ago. Changes made at last visit include continue wellbutrin.   She reports good compliance with treatment. She is not having side effects. none  She reports good tolerance of treatment. Current symptoms include: depressed mood She feels she is Improved since last visit.  Depression screen [facility] Fall 2/9 09/06/2021 02/19/2021 10/26/2020  Decreased Interest 1 1 2   Down, Depressed, Hopeless 1 1 2   PHQ - 2 Score 2 2 4   Altered sleeping 1 1 1   Tired, decreased energy 1 0 1  Change in appetite 1 0 1  Feeling bad or failure about yourself  1 0 2  Trouble concentrating 1 0 1  Moving slowly or fidgety/restless 0 0 0  Suicidal thoughts 0 0 0  PHQ-9 Score 7 3 10   Difficult doing work/chores Somewhat difficult Not difficult at all Somewhat difficult    -----------------------------------------------------------------------------------------  Lipid/Cholesterol, Follow-up  Last lipid panel Other pertinent labs  Lab Results  Component Value Date   CHOL 234 (H) 11/13/2020   HDL 44 11/13/2020   LDLCALC 162 (H) 11/13/2020   TRIG 154 (H) 11/13/2020   CHOLHDL 5.3 (H) 11/13/2020   Lab Results  Component Value Date   ALT 14 11/13/2020   AST 22 11/13/2020   PLT 156 08/22/2019     She  was last seen for this 7 months ago.  Management since that visit includes no statin.  Symptoms: No chest pain No chest pressure/discomfort  No dyspnea No lower extremity edema  No numbness or tingling of extremity No orthopnea  No palpitations No paroxysmal nocturnal dyspnea  No speech difficulty No syncope   Current diet: in general, a "healthy" diet  , well balanced Current exercise: swimming  The 10-year ASCVD risk score (Arnett DK, et al., 2019) is: 2.3%  --------------------------------------------------------------------------------------------------- Prediabetes, Follow-up  Lab Results  Component Value Date   HGBA1C 5.7 (H) 11/13/2020   HGBA1C 6.3 (A) 07/24/2020   HGBA1C 5.7 (H) 02/17/2020   GLUCOSE 104 (H) 11/13/2020   GLUCOSE 92 02/17/2020   GLUCOSE 109 (H) 08/22/2019    Last seen for for this7 months ago.  Management since that visit includes lifestyle changes. Current symptoms include none and have been unchanged.  Prior visit with dietician: no Current diet: in general, a "healthy" diet  , well balanced Current exercise: swimming  Pertinent Labs:    Component Value Date/Time   CHOL 234 (H) 11/13/2020 0901   TRIG 154 (H) 11/13/2020 0901   CHOLHDL 5.3 (H) 11/13/2020 0901   CREATININE 1.01 (H) 11/13/2020 0901    Wt Readings from Last 3 Encounters:  09/06/21 186 lb 11.2 oz (84.7 kg)  02/19/21 182 lb (82.6 kg)  10/26/20 177 lb (80.3 kg)    -----------------------------------------------------------------------------------------  Medications: Outpatient Medications Prior to Visit  Medication Sig   HM LIDOCAINE PATCH EX Apply 1 patch topically daily.   naloxone (NARCAN) 4 MG/0.1ML LIQD nasal spray kit USE UTD FOR SUSPECTED OVERDOSE.   oxyCODONE ER (XTAMPZA ER) 9 MG C12A Take by mouth 3 (three) times daily.    pregabalin (LYRICA) 150 MG capsule Take 1 capsule (150 mg total) by mouth 3 (three) times daily.   [DISCONTINUED] buPROPion (WELLBUTRIN XL)  150 MG 24 hr tablet Take 1 tablet (150 mg total) by mouth daily.   diltiazem (CARDIZEM CD) 120 MG 24 hr capsule Take 120 mg by mouth daily.   No facility-administered medications prior to visit.    Review of Systems per HPI      Objective    BP 105/81 (BP Location: Right Arm, Patient Position: Sitting, Cuff Size: Normal)   Pulse 77   Temp 98.3 F (36.8 C) (Oral)   Ht 5' 4"  (1.626 m)   Wt 186 lb 11.2 oz (84.7 kg)   BMI 32.05 kg/m  BP Readings from Last 3 Encounters:  09/06/21 105/81  02/19/21 97/68  10/26/20 110/76   Wt Readings from Last 3 Encounters:  09/06/21 186 lb 11.2 oz (84.7 kg)  02/19/21 182 lb (82.6 kg)  10/26/20 177 lb (80.3 kg)      Physical Exam Vitals reviewed.  Constitutional:      General: She is not in acute distress.    Appearance: Normal appearance. She is well-developed. She is not diaphoretic.  HENT:     Head: Normocephalic and atraumatic.  Eyes:     General: No scleral icterus.    Conjunctiva/sclera: Conjunctivae normal.  Neck:     Thyroid: No thyromegaly.  Cardiovascular:     Rate and Rhythm: Normal rate and regular rhythm.     Pulses: Normal pulses.     Heart sounds: Normal heart sounds. No murmur heard. Pulmonary:     Effort: Pulmonary effort is normal. No respiratory distress.     Breath sounds: Normal breath sounds. No wheezing, rhonchi or rales.  Musculoskeletal:     Cervical back: Neck supple.     Right lower leg: No edema.     Left lower leg: No edema.  Lymphadenopathy:     Cervical: No cervical adenopathy.  Skin:    General: Skin is warm and dry.     Findings: No rash.  Neurological:     Mental Status: She is alert and oriented to person, place, and time. Mental status is at baseline.  Psychiatric:        Mood and Affect: Mood normal.        Behavior: Behavior normal.     No results found for any visits on 09/06/21.  Assessment & Plan     Problem List Items Addressed This Visit       Cardiovascular and Mediastinum    Paroxysmal atrial fibrillation (HCC)    Currently in NSR F/b cardiology Continue Diltiazem for rate control Not on anticoag        Nervous and Auditory   Complex regional pain syndrome I    SCS in place F/b pain management - they manage all of her medications (Oxy and lyrica)      Relevant Medications   buPROPion (WELLBUTRIN XL) 300 MG 24 hr tablet     Other   Obesity    Discussed importance of healthy weight management Discussed diet and exercise       MDD (major depressive disorder) - Primary  Chronic and uncontrolled with long depressive episode recently related to disability Will increase Wellbutrin to 336m daily XL      Relevant Medications   buPROPion (WELLBUTRIN XL) 300 MG 24 hr tablet   Prediabetes    Recommend low carb diet Recheck A1c      Relevant Orders   Hemoglobin A1c   Mixed hyperlipidemia    Reviewed last lipid panel Not currently on a statin Recheck FLP and CMP Discussed diet and exercise       Relevant Orders   Comprehensive metabolic panel   Lipid panel   Other Visit Diagnoses     Need for immunization against influenza       Relevant Orders   Flu Vaccine QUAD 652moM (Fluarix, Fluzone & Alfiuria Quad PF) (Completed)        Return in about 6 months (around 03/07/2022) for CPE.      I, AnLavon PaganiniMD, have reviewed all documentation for this visit. The documentation on 09/06/21 for the exam, diagnosis, procedures, and orders are all accurate and complete.   Meighan Treto, AnDionne BucyMD, MPH [facility]

## 2021-10-09 DIAGNOSIS — F330 Major depressive disorder, recurrent, mild: Secondary | ICD-10-CM | POA: Diagnosis not present

## 2021-10-09 DIAGNOSIS — G90529 Complex regional pain syndrome I of unspecified lower limb: Secondary | ICD-10-CM | POA: Diagnosis not present

## 2021-10-09 DIAGNOSIS — G894 Chronic pain syndrome: Secondary | ICD-10-CM | POA: Diagnosis not present

## 2021-10-09 DIAGNOSIS — I4891 Unspecified atrial fibrillation: Secondary | ICD-10-CM | POA: Diagnosis not present

## 2021-10-09 DIAGNOSIS — M542 Cervicalgia: Secondary | ICD-10-CM | POA: Diagnosis not present

## 2021-10-09 DIAGNOSIS — G8929 Other chronic pain: Secondary | ICD-10-CM | POA: Diagnosis not present

## 2021-10-09 DIAGNOSIS — M5416 Radiculopathy, lumbar region: Secondary | ICD-10-CM | POA: Diagnosis not present

## 2021-10-09 DIAGNOSIS — M797 Fibromyalgia: Secondary | ICD-10-CM | POA: Diagnosis not present

## 2021-10-09 DIAGNOSIS — Z79891 Long term (current) use of opiate analgesic: Secondary | ICD-10-CM | POA: Diagnosis not present

## 2021-10-14 DIAGNOSIS — E782 Mixed hyperlipidemia: Secondary | ICD-10-CM | POA: Diagnosis not present

## 2021-10-14 DIAGNOSIS — R7303 Prediabetes: Secondary | ICD-10-CM | POA: Diagnosis not present

## 2021-10-15 LAB — COMPREHENSIVE METABOLIC PANEL
ALT: 13 IU/L (ref 0–32)
AST: 14 IU/L (ref 0–40)
Albumin/Globulin Ratio: 1.7 (ref 1.2–2.2)
Albumin: 4.5 g/dL (ref 3.8–4.9)
Alkaline Phosphatase: 100 IU/L (ref 44–121)
BUN/Creatinine Ratio: 7 — ABNORMAL LOW (ref 9–23)
BUN: 7 mg/dL (ref 6–24)
Bilirubin Total: 0.2 mg/dL (ref 0.0–1.2)
CO2: 26 mmol/L (ref 20–29)
Calcium: 9.1 mg/dL (ref 8.7–10.2)
Chloride: 105 mmol/L (ref 96–106)
Creatinine, Ser: 0.95 mg/dL (ref 0.57–1.00)
Globulin, Total: 2.7 g/dL (ref 1.5–4.5)
Glucose: 99 mg/dL (ref 70–99)
Potassium: 4.7 mmol/L (ref 3.5–5.2)
Sodium: 142 mmol/L (ref 134–144)
Total Protein: 7.2 g/dL (ref 6.0–8.5)
eGFR: 70 mL/min/{1.73_m2} (ref >59)

## 2021-10-15 LAB — HEMOGLOBIN A1C
Est. average glucose Bld gHb Est-mCnc: 117 mg/dL
Hgb A1c MFr Bld: 5.7 % — ABNORMAL HIGH (ref 4.8–5.6)

## 2021-10-15 LAB — LIPID PANEL
Chol/HDL Ratio: 5.4 ratio — ABNORMAL HIGH (ref 0.0–4.4)
Cholesterol, Total: 216 mg/dL — ABNORMAL HIGH (ref 100–199)
HDL: 40 mg/dL (ref >39)
LDL Chol Calc (NIH): 145 mg/dL — ABNORMAL HIGH (ref 0–99)
Triglycerides: 172 mg/dL — ABNORMAL HIGH (ref 0–149)
VLDL Cholesterol Cal: 31 mg/dL (ref 5–40)

## 2021-10-21 DIAGNOSIS — M797 Fibromyalgia: Secondary | ICD-10-CM | POA: Diagnosis not present

## 2021-10-21 DIAGNOSIS — I480 Paroxysmal atrial fibrillation: Secondary | ICD-10-CM | POA: Diagnosis not present

## 2021-12-18 DIAGNOSIS — I4891 Unspecified atrial fibrillation: Secondary | ICD-10-CM | POA: Diagnosis not present

## 2021-12-18 DIAGNOSIS — G8929 Other chronic pain: Secondary | ICD-10-CM | POA: Diagnosis not present

## 2021-12-18 DIAGNOSIS — M797 Fibromyalgia: Secondary | ICD-10-CM | POA: Diagnosis not present

## 2021-12-18 DIAGNOSIS — G90529 Complex regional pain syndrome I of unspecified lower limb: Secondary | ICD-10-CM | POA: Diagnosis not present

## 2021-12-18 DIAGNOSIS — M5416 Radiculopathy, lumbar region: Secondary | ICD-10-CM | POA: Diagnosis not present

## 2021-12-18 DIAGNOSIS — F330 Major depressive disorder, recurrent, mild: Secondary | ICD-10-CM | POA: Diagnosis not present

## 2021-12-18 DIAGNOSIS — M542 Cervicalgia: Secondary | ICD-10-CM | POA: Diagnosis not present

## 2021-12-18 DIAGNOSIS — G894 Chronic pain syndrome: Secondary | ICD-10-CM | POA: Diagnosis not present

## 2021-12-18 DIAGNOSIS — Z79891 Long term (current) use of opiate analgesic: Secondary | ICD-10-CM | POA: Diagnosis not present

## 2022-01-15 DIAGNOSIS — Z79891 Long term (current) use of opiate analgesic: Secondary | ICD-10-CM | POA: Diagnosis not present

## 2022-01-15 DIAGNOSIS — F330 Major depressive disorder, recurrent, mild: Secondary | ICD-10-CM | POA: Diagnosis not present

## 2022-01-15 DIAGNOSIS — M797 Fibromyalgia: Secondary | ICD-10-CM | POA: Diagnosis not present

## 2022-01-15 DIAGNOSIS — I4891 Unspecified atrial fibrillation: Secondary | ICD-10-CM | POA: Diagnosis not present

## 2022-01-15 DIAGNOSIS — G90529 Complex regional pain syndrome I of unspecified lower limb: Secondary | ICD-10-CM | POA: Diagnosis not present

## 2022-01-15 DIAGNOSIS — G8929 Other chronic pain: Secondary | ICD-10-CM | POA: Diagnosis not present

## 2022-01-15 DIAGNOSIS — M542 Cervicalgia: Secondary | ICD-10-CM | POA: Diagnosis not present

## 2022-01-15 DIAGNOSIS — M5416 Radiculopathy, lumbar region: Secondary | ICD-10-CM | POA: Diagnosis not present

## 2022-01-15 DIAGNOSIS — G894 Chronic pain syndrome: Secondary | ICD-10-CM | POA: Diagnosis not present

## 2022-02-05 DIAGNOSIS — F321 Major depressive disorder, single episode, moderate: Secondary | ICD-10-CM | POA: Diagnosis not present

## 2022-02-12 DIAGNOSIS — I4891 Unspecified atrial fibrillation: Secondary | ICD-10-CM | POA: Diagnosis not present

## 2022-02-12 DIAGNOSIS — M542 Cervicalgia: Secondary | ICD-10-CM | POA: Diagnosis not present

## 2022-02-12 DIAGNOSIS — G894 Chronic pain syndrome: Secondary | ICD-10-CM | POA: Diagnosis not present

## 2022-02-12 DIAGNOSIS — F330 Major depressive disorder, recurrent, mild: Secondary | ICD-10-CM | POA: Diagnosis not present

## 2022-02-12 DIAGNOSIS — M797 Fibromyalgia: Secondary | ICD-10-CM | POA: Diagnosis not present

## 2022-02-12 DIAGNOSIS — Z79891 Long term (current) use of opiate analgesic: Secondary | ICD-10-CM | POA: Diagnosis not present

## 2022-02-12 DIAGNOSIS — G90529 Complex regional pain syndrome I of unspecified lower limb: Secondary | ICD-10-CM | POA: Diagnosis not present

## 2022-02-12 DIAGNOSIS — M5416 Radiculopathy, lumbar region: Secondary | ICD-10-CM | POA: Diagnosis not present

## 2022-02-12 DIAGNOSIS — G8929 Other chronic pain: Secondary | ICD-10-CM | POA: Diagnosis not present

## 2022-02-14 DIAGNOSIS — F322 Major depressive disorder, single episode, severe without psychotic features: Secondary | ICD-10-CM | POA: Diagnosis not present

## 2022-03-05 NOTE — Progress Notes (Deleted)
? ? ? ?Complete physical exam ? ? ?Patient: Julie Berger   DOB: [birth year]   56 y.o. Female  MRN: [id number] ?Visit Date: 03/07/2022 ? ?Today's healthcare provider: Lavon Paganini, MD  ? ?No chief complaint on file. ? ?Subjective  ?  ?Julie Berger is a 56 y.o. female who presents today for a complete physical exam.  ?She reports consuming a {diet types:17450} diet. {Exercise:19826} She generally feels {well/fairly well/poorly:18703}. She reports sleeping {well/fairly well/poorly:18703}. She {does/does not:200015} have additional problems to discuss today.  ?HPI  ?*** ? ?Past Medical History:  ?Diagnosis Date  ? Atrial fibrillation ([place])   ? Barrett esophagus   ? Blood transfusion without reported diagnosis   ? Cancer [facility])   ? cervical  ? Chronic pain   ? Complex regional pain syndrome I   ? Fibromyalgia   ? 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 Other foreign object in esophagus causing other injury, initial encounter   ? ?Past Surgical History:  ?Procedure Laterality Date  ? BACK SURGERY    ? x 3  ? CHOLECYSTECTOMY    ? ESOPHAGOGASTRODUODENOSCOPY (EGD) WITH PROPOFOL N/A 02/02/2020  ? Procedure: ESOPHAGOGASTRODUODENOSCOPY (EGD) WITH PROPOFOL;  Surgeon: Lucilla Lame, MD;  Location: [facility] ENDOSCOPY;  Service: Endoscopy;  Laterality: N/A;  ? HIATAL HERNIA REPAIR    ? HYSTERECTOMY ABDOMINAL WITH SALPINGO-OOPHORECTOMY    ? KNEE ARTHROSCOPY Left   ? SPINAL CORD STIMULATOR IMPLANT    ? TONSILLECTOMY    ? TRANSORAL INCISIONLESS FUNDOPLICATION    ? ULNAR NERVE TRANSPOSITION Left   ? ?Social History  ? ?Socioeconomic History  ? Marital status: Divorced  ?  Spouse name: Not on file  ? Number of children: 6  ? Years of education: Not on file  ? Highest education level: Not on file  ?Occupational History  ? Occupation: Disabiled  ?Tobacco Use  ? Smoking status: Former  ?  Packs/day: 1.00  ?  Years: 20.00  ?  Pack years: 20.00  ?  Types: Cigarettes  ?  Quit date: 08/22/1999  ?  Years since quitting: 22.5  ?  Smokeless tobacco: Never  ?Vaping Use  ? Vaping Use: Never used  ?Substance and Sexual Activity  ? Alcohol use: Never  ? Drug use: Never  ? Sexual activity: Never  ?Other Topics Concern  ? Not on file  ?Social History Narrative  ? Not on file  ? ?Social Determinants of Health  ? ?Financial Resource Strain: Not on file  ?Food Insecurity: Not on file  ?Transportation Needs: Not on file  ?Physical Activity: Not on file  ?Stress: Not on file  ?Social Connections: Not on file  ?Intimate Partner Violence: Not on file  ? ?Family Status  ?Relation Name Status  ? Father  Alive  ? Mother  Alive  ? Brother  Alive  ? Sister  Deceased  ? Daughter  Alive  ? Son  Alive  ? Brother  Deceased  ? Brother  Alive  ? Daughter  Alive  ? Son  Alive  ? Son  Alive  ? Son  Alive  ? Neg Hx  (Not Specified)  ? ?Family History  ?Problem Relation Age of Onset  ? Heart attack Father   ? Mitral valve prolapse Father   ? Hypertension Father   ? Dementia Mother   ? Lupus Mother   ? Fibromyalgia Mother   ? Diabetes Brother   ? Cancer Brother   ?     unknown type  ? Bone  cancer Brother   ? Colon cancer Neg Hx   ? Breast cancer Neg Hx   ? ?Allergies  ?Allergen Reactions  ? Nsaids Shortness Of Breath  ?  ?Patient Care Team: ?Virginia Crews, MD as PCP - General (Family Medicine)  ? ?Medications: ?Outpatient Medications Prior to Visit  ?Medication Sig  ? buPROPion (WELLBUTRIN XL) 300 MG 24 hr tablet Take 1 tablet (300 mg total) by mouth daily.  ? diltiazem (CARDIZEM CD) 120 MG 24 hr capsule Take 120 mg by mouth daily.  ? HM LIDOCAINE PATCH EX Apply 1 patch topically daily.  ? naloxone (NARCAN) 4 MG/0.1ML LIQD nasal spray kit USE UTD FOR SUSPECTED OVERDOSE.  ? oxyCODONE ER (XTAMPZA ER) 9 MG C12A Take by mouth 3 (three) times daily.   ? pregabalin (LYRICA) 150 MG capsule Take 1 capsule (150 mg total) by mouth 3 (three) times daily.  ? ?No facility-administered medications prior to visit.  ? ? ?Review of Systems ? ?{Labs  Heme  Chem  Endocrine   Serology  Results Review (optional):23779} ? Objective  ?  ?There were no vitals taken for this visit. ?{Show previous vital signs (optional):23777} ? ? ?Physical Exam  ?*** ? ?Last depression screening scores ? ?  09/06/2021  ? 10:36 AM 02/19/2021  ?  2:27 PM 10/26/2020  ? 11:09 AM  ?PHQ 2/9 Scores  ?PHQ - 2 Score 2 2 4   ?PHQ- 9 Score 7 3 10   ? ?Last fall risk screening ? ?  09/06/2021  ? 10:35 AM  ?Fall Risk   ?Falls in the past year? 0  ?Number falls in past yr: 0  ?Injury with Fall? 0  ?Risk for fall due to : No Fall Risks  ? ?Last Audit-C alcohol use screening ? ?  09/06/2021  ? 10:39 AM  ?Alcohol Use Disorder Test (AUDIT)  ?1. How often do you have a drink containing alcohol? 0  ?2. How many drinks containing alcohol do you have on a typical day when you are drinking? 0  ?3. How often do you have six or more drinks on one occasion? 0  ?AUDIT-C Score 0  ? ?A score of 3 or more in women, and 4 or more in men indicates increased risk for alcohol abuse, EXCEPT if all of the points are from question 1  ? ?No results found for any visits on 03/07/22. ? Assessment & Plan  ?  ?Routine Health Maintenance and Physical Exam ? ?Exercise Activities and Dietary recommendations ? Goals   ?None ?  ? ? ?Immunization History  ?Administered Date(s) Administered  ? Influenza,inj,Quad PF,6+ Mos 11/11/2019, 07/24/2020, 09/06/2021  ? Moderna Sars-Covid-2 Vaccination 03/17/2020, 04/14/2020  ? Tdap 02/17/2020  ? Zoster Recombinat (Shingrix) 02/17/2020  ? ? ?Health Maintenance  ?Topic Date Due  ? MAMMOGRAM  12/17/2019  ? Zoster Vaccines- Shingrix (2 of 2) 04/13/2020  ? COVID-19 Vaccine (3 - Booster for Moderna series) 06/09/2020  ? INFLUENZA VACCINE  06/24/2022  ? Fecal DNA (Cologuard)  12/28/2022  ? TETANUS/TDAP  02/16/2030  ? Hepatitis C Screening  Completed  ? HIV Screening  Completed  ? HPV VACCINES  Aged Out  ? ? ?Discussed health benefits of physical activity, and encouraged her to engage in regular exercise appropriate for her  age and condition. ? ?*** ? ?No follow-ups on file.  ?  ? ?{provider attestation***:1} ? ? ?Lavon Paganini, MD  ?[facility] ?[phone] (phone) ?[phone] (fax) ? ?[facility] ?

## 2022-03-07 ENCOUNTER — Ambulatory Visit: Payer: Medicaid Other | Admitting: Family Medicine

## 2022-03-07 ENCOUNTER — Telehealth: Payer: Self-pay

## 2022-03-07 NOTE — Telephone Encounter (Signed)
Copied from [place] [phone]. Topic: General - Other ?>> Mar 07, 2022 10:46 AM Holley Dexter N wrote: ?Reason for CRM: Pt called in stating she has her appt today at 10:40 but is running behind, she said she should get there by the 10 min cut off frame, but since she is disabled it may take just a little bit longer for her to get out, and that may run her over the 10 min grace period, please advise. ?

## 2022-03-10 ENCOUNTER — Other Ambulatory Visit: Payer: Self-pay | Admitting: Family Medicine

## 2022-03-11 NOTE — Telephone Encounter (Signed)
Requested Prescriptions  ?Pending Prescriptions Disp Refills  ?? buPROPion (WELLBUTRIN XL) 300 MG 24 hr tablet [Pharmacy Med Name: buPROPion HCl ER (XL) 300 MG Oral Tablet Extended Release 24 Hour] 90 tablet 0  ?  Sig: Take 1 tablet by mouth once daily  ?  ? Psychiatry: Antidepressants - bupropion Failed - 03/10/2022  1:02 PM  ?  ?  Failed - Valid encounter within last 6 months  ?  Recent Outpatient Visits   ?      ? 6 months ago Recurrent major depressive disorder, in partial remission ([place])  ? [facility], Dionne Bucy, MD  ? 1 year ago Encounter for annual physical exam  ? [facility], Dionne Bucy, MD  ? 1 year ago Prediabetes  ? [facility], Dionne Bucy, MD  ? 1 year ago Prediabetes  ? [facility], Dionne Bucy, MD  ? 2 years ago Encounter for annual physical exam  ? [facility], Dionne Bucy, MD  ?  ?  ?Future Appointments   ?        ? In 1 month [place], Dionne Bucy, MD [facility]  ?  ? ?  ?  ?  Passed - Cr in normal range and within 360 days  ?  Creatinine, Ser  ?Date Value Ref Range Status  ?10/14/2021 0.95 0.57 - 1.00 mg/dL Final  ?   ?  ?  Passed - AST in normal range and within 360 days  ?  AST  ?Date Value Ref Range Status  ?10/14/2021 14 0 - 40 IU/L Final  ?   ?  ?  Passed - ALT in normal range and within 360 days  ?  ALT  ?Date Value Ref Range Status  ?10/14/2021 13 0 - 32 IU/L Final  ?   ?  ?  Passed - Completed PHQ-2 or PHQ-9 in the last 360 days  ?  ?  Passed - Last BP in normal range  ?  BP Readings from Last 1 Encounters:  ?09/06/21 105/81  ?   ?  ?  ? ? ?

## 2022-03-12 DIAGNOSIS — M542 Cervicalgia: Secondary | ICD-10-CM | POA: Diagnosis not present

## 2022-03-12 DIAGNOSIS — F330 Major depressive disorder, recurrent, mild: Secondary | ICD-10-CM | POA: Diagnosis not present

## 2022-03-12 DIAGNOSIS — G8929 Other chronic pain: Secondary | ICD-10-CM | POA: Diagnosis not present

## 2022-03-12 DIAGNOSIS — G894 Chronic pain syndrome: Secondary | ICD-10-CM | POA: Diagnosis not present

## 2022-03-12 DIAGNOSIS — M5416 Radiculopathy, lumbar region: Secondary | ICD-10-CM | POA: Diagnosis not present

## 2022-03-12 DIAGNOSIS — I4891 Unspecified atrial fibrillation: Secondary | ICD-10-CM | POA: Diagnosis not present

## 2022-03-12 DIAGNOSIS — G90529 Complex regional pain syndrome I of unspecified lower limb: Secondary | ICD-10-CM | POA: Diagnosis not present

## 2022-03-12 DIAGNOSIS — M797 Fibromyalgia: Secondary | ICD-10-CM | POA: Diagnosis not present

## 2022-03-12 DIAGNOSIS — Z79891 Long term (current) use of opiate analgesic: Secondary | ICD-10-CM | POA: Diagnosis not present

## 2022-03-12 DIAGNOSIS — Z79899 Other long term (current) drug therapy: Secondary | ICD-10-CM | POA: Diagnosis not present

## 2022-03-14 DIAGNOSIS — F321 Major depressive disorder, single episode, moderate: Secondary | ICD-10-CM | POA: Diagnosis not present

## 2022-04-11 DIAGNOSIS — F322 Major depressive disorder, single episode, severe without psychotic features: Secondary | ICD-10-CM | POA: Diagnosis not present

## 2022-04-23 DIAGNOSIS — M5416 Radiculopathy, lumbar region: Secondary | ICD-10-CM | POA: Diagnosis not present

## 2022-04-23 DIAGNOSIS — F330 Major depressive disorder, recurrent, mild: Secondary | ICD-10-CM | POA: Diagnosis not present

## 2022-04-23 DIAGNOSIS — G8929 Other chronic pain: Secondary | ICD-10-CM | POA: Diagnosis not present

## 2022-04-23 DIAGNOSIS — G894 Chronic pain syndrome: Secondary | ICD-10-CM | POA: Diagnosis not present

## 2022-04-23 DIAGNOSIS — Z79891 Long term (current) use of opiate analgesic: Secondary | ICD-10-CM | POA: Diagnosis not present

## 2022-04-23 DIAGNOSIS — I4891 Unspecified atrial fibrillation: Secondary | ICD-10-CM | POA: Diagnosis not present

## 2022-04-23 DIAGNOSIS — G90529 Complex regional pain syndrome I of unspecified lower limb: Secondary | ICD-10-CM | POA: Diagnosis not present

## 2022-04-23 DIAGNOSIS — M542 Cervicalgia: Secondary | ICD-10-CM | POA: Diagnosis not present

## 2022-04-23 DIAGNOSIS — M797 Fibromyalgia: Secondary | ICD-10-CM | POA: Diagnosis not present

## 2022-04-23 DIAGNOSIS — Z79899 Other long term (current) drug therapy: Secondary | ICD-10-CM | POA: Diagnosis not present

## 2022-05-01 NOTE — Patient Instructions (Signed)
Please call [facility] to schedule your annual routine mammogram [phone]  

## 2022-05-01 NOTE — Progress Notes (Signed)
I,Sulibeya S Dimas,acting as a Education administrator for Lavon Paganini, MD.,have documented all relevant documentation on the behalf of Lavon Paganini, MD,as directed by  Lavon Paganini, MD while in the presence of Lavon Paganini, MD.   Complete physical exam   Patient: Julie Berger   DOB: Nov 06, 1966   56 y.o. Female  MRN: [id number] Visit Date: 05/02/2022  Today's healthcare provider: Lavon Paganini, MD   Chief Complaint  Patient presents with   Annual Exam   Subjective    Charley Ebrahim is a 57 y.o. female who presents today for a complete physical exam.  She reports consuming a general diet. Gym/ health club routine includes swimming. She generally feels fairly well. She reports sleeping fairly well. She does have additional problems to discuss today.  HPI   Patient concerned about family history of cancer. Brother passed away 1.5 yrs ago from renal failure 2/2 bone cancer.  She also found out that another brother had bladder cancer.  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   Other foreign object in esophagus causing other injury, initial encounter    Past Surgical History:  Procedure Laterality Date   BACK SURGERY     x 3   CHOLECYSTECTOMY     ESOPHAGOGASTRODUODENOSCOPY (EGD) WITH PROPOFOL N/A 02/02/2020   Procedure: ESOPHAGOGASTRODUODENOSCOPY (EGD) WITH PROPOFOL;  Surgeon: Lucilla Lame, MD;  Location: [facility] ENDOSCOPY;  Service: Endoscopy;  Laterality: N/A;   HIATAL HERNIA REPAIR     HYSTERECTOMY ABDOMINAL WITH SALPINGO-OOPHORECTOMY     KNEE ARTHROSCOPY Left    SPINAL CORD STIMULATOR IMPLANT     TONSILLECTOMY     TRANSORAL INCISIONLESS FUNDOPLICATION     ULNAR NERVE TRANSPOSITION Left    Social History    Socioeconomic History   Marital status: Divorced    Spouse name: Not on file   Number of children: 6   Years of education: Not on file   Highest education level: Not on file  Occupational History   Occupation: Disabiled  Tobacco Use   Smoking status: Former    Packs/day: 1.00    Years: 20.00    Total pack years: 20.00    Types: Cigarettes    Quit date: 08/22/1999    Years since quitting: 22.7   Smokeless tobacco: Never  Vaping Use   Vaping Use: Never used  Substance and Sexual Activity   Alcohol use: Never   Drug use: Never   Sexual activity: Never  Other Topics Concern   Not on file  Social History Narrative   Not on file   Social Determinants of Health   Financial Resource Strain: Not on file  Food Insecurity: Not on file  Transportation Needs: Not on file  Physical Activity: Not on file  Stress: Not on file  Social Connections: Not on file  Intimate Partner Violence: Not on file   Family Status  Relation Name Status   Father  Alive   Mother  Alive   Brother  Alive   Sister  Deceased   Daughter  Alive   Son  Alive   Brother  Deceased   Brother  Alive   Daughter  Alive   Son  Alive   Son  Alive  Son  Alive   Neg Hx  (Not Specified)   Family History  Problem Relation Age of Onset   Heart attack Father    Mitral valve prolapse Father    Hypertension Father    Dementia Mother    Lupus Mother    Fibromyalgia Mother    Diabetes Brother    Cancer Brother        unknown type   Bone cancer Brother    Colon cancer Neg Hx    Breast cancer Neg Hx    Allergies  Allergen Reactions   Nsaids Shortness Of Breath    Patient Care Team: Virginia Crews, MD as PCP - General (Family Medicine)   Medications: Outpatient Medications Prior to Visit  Medication Sig   diltiazem (CARDIZEM CD) 120 MG 24 hr capsule Take 120 mg by mouth daily.   dronabinol (MARINOL) 2.5 MG capsule Take 2.5 mg by mouth daily as needed.   HM LIDOCAINE PATCH EX Apply 1 patch  topically daily.   naloxone (NARCAN) 4 MG/0.1ML LIQD nasal spray kit USE UTD FOR SUSPECTED OVERDOSE.   oxyCODONE ER (XTAMPZA ER) 9 MG C12A Take by mouth 2 (two) times daily.   pregabalin (LYRICA) 150 MG capsule Take 1 capsule (150 mg total) by mouth 3 (three) times daily.   [DISCONTINUED] buPROPion (WELLBUTRIN XL) 300 MG 24 hr tablet Take 1 tablet by mouth once daily   No facility-administered medications prior to visit.    Review of Systems  All other systems reviewed and are negative.   Last CBC Lab Results  Component Value Date   WBC 8.8 08/22/2019   HGB 12.6 08/22/2019   HCT 39.5 08/22/2019   MCV 83.9 08/22/2019   MCH 26.8 08/22/2019   RDW 13.5 08/22/2019   PLT 156 08/22/5746   Last metabolic panel Lab Results  Component Value Date   GLUCOSE 99 10/14/2021   NA 142 10/14/2021   K 4.7 10/14/2021   CL 105 10/14/2021   CO2 26 10/14/2021   BUN 7 10/14/2021   CREATININE 0.95 10/14/2021   EGFR 70 10/14/2021   CALCIUM 9.1 10/14/2021   PROT 7.2 10/14/2021   ALBUMIN 4.5 10/14/2021   LABGLOB 2.7 10/14/2021   AGRATIO 1.7 10/14/2021   BILITOT 0.2 10/14/2021   ALKPHOS 100 10/14/2021   AST 14 10/14/2021   ALT 13 10/14/2021   ANIONGAP 8 08/22/2019   Last lipids Lab Results  Component Value Date   CHOL 216 (H) 10/14/2021   HDL 40 10/14/2021   LDLCALC 145 (H) 10/14/2021   TRIG 172 (H) 10/14/2021   CHOLHDL 5.4 (H) 10/14/2021   Last hemoglobin A1c Lab Results  Component Value Date   HGBA1C 5.7 (H) 10/14/2021   Last vitamin D Lab Results  Component Value Date   VD25OH 30 03/16/2018      Objective     BP 121/86 (BP Location: Left Arm, Patient Position: Sitting, Cuff Size: Large)   Pulse 87   Temp 98.4 F (36.9 C) (Oral)   Resp 16   Ht 5' 4"  (1.626 m)   Wt 181 lb 11.2 oz (82.4 kg)   BMI 31.19 kg/m  BP Readings from Last 3 Encounters:  05/02/22 121/86  09/06/21 105/81  02/19/21 97/68   Wt Readings from Last 3 Encounters:  05/02/22 181 lb 11.2 oz (82.4  kg)  09/06/21 186 lb 11.2 oz (84.7 kg)  02/19/21 182 lb (82.6 kg)     Physical Exam Vitals reviewed.  Constitutional:  General: She is not in acute distress.    Appearance: Normal appearance. She is well-developed. She is not diaphoretic.  HENT:     Head: Normocephalic and atraumatic.     Right Ear: Tympanic membrane, ear canal and external ear normal.     Left Ear: Tympanic membrane, ear canal and external ear normal.     Nose: Nose normal.     Mouth/Throat:     Mouth: Mucous membranes are moist.     Pharynx: Oropharynx is clear. No oropharyngeal exudate.  Eyes:     General: No scleral icterus.    Conjunctiva/sclera: Conjunctivae normal.     Pupils: Pupils are equal, round, and reactive to light.  Neck:     Thyroid: No thyromegaly.  Cardiovascular:     Rate and Rhythm: Normal rate and regular rhythm.     Heart sounds: Normal heart sounds. No murmur heard. Pulmonary:     Effort: Pulmonary effort is normal. No respiratory distress.     Breath sounds: Normal breath sounds. No wheezing or rales.  Abdominal:     General: There is no distension.     Palpations: Abdomen is soft.     Tenderness: There is no abdominal tenderness.  Musculoskeletal:        General: No deformity.     Cervical back: Neck supple.     Right lower leg: No edema.     Left lower leg: No edema.  Lymphadenopathy:     Cervical: No cervical adenopathy.  Skin:    General: Skin is warm and dry.     Findings: No rash.  Neurological:     Mental Status: She is alert and oriented to person, place, and time. Mental status is at baseline.     Gait: Gait normal.  Psychiatric:        Mood and Affect: Mood normal.        Behavior: Behavior normal.        Thought Content: Thought content normal.       Last depression screening scores    09/06/2021   10:36 AM 02/19/2021    2:27 PM 10/26/2020   11:09 AM  PHQ 2/9 Scores  PHQ - 2 Score 2 2 4   PHQ- 9 Score 7 3 10    Last fall risk screening     09/06/2021   10:35 AM  [place] in the past year? 0  Number falls in past yr: 0  Injury with Fall? 0  Risk for fall due to : No Fall Risks   Last Audit-C alcohol use screening    09/06/2021   10:39 AM  Alcohol Use Disorder Test (AUDIT)  1. How often do you have a drink containing alcohol? 0  2. How many drinks containing alcohol do you have on a typical day when you are drinking? 0  3. How often do you have six or more drinks on one occasion? 0  AUDIT-C Score 0   A score of 3 or more in women, and 4 or more in men indicates increased risk for alcohol abuse, EXCEPT if all of the points are from question 1   No results found for any visits on 05/02/22.  Assessment & Plan    Routine Health Maintenance and Physical Exam  Exercise Activities and Dietary recommendations  Goals   None     Immunization History  Administered Date(s) Administered   Influenza,inj,Quad PF,6+ Mos 11/11/2019, 07/24/2020, 09/06/2021   Moderna Sars-Covid-2 Vaccination 03/17/2020, 04/14/2020  Tdap 02/17/2020   Zoster Recombinat (Shingrix) 02/17/2020    Health Maintenance  Topic Date Due   MAMMOGRAM  12/17/2019   Zoster Vaccines- Shingrix (2 of 2) 04/13/2020   COVID-19 Vaccine (3 - Moderna series) 06/09/2020   INFLUENZA VACCINE  06/24/2022   Fecal DNA (Cologuard)  12/28/2022   TETANUS/TDAP  02/16/2030   Hepatitis C Screening  Completed   HIV Screening  Completed   HPV VACCINES  Aged Out    Discussed health benefits of physical activity, and encouraged her to engage in regular exercise appropriate for her age and condition.  Problem List Items Addressed This Visit       Cardiovascular and Mediastinum   Paroxysmal atrial fibrillation (HCC)    Currently in NSR F/b Cardiology Continue Diltiazem for rate control Not on anticoag        Nervous and Auditory   Complex regional pain syndrome I    SCS in place F/b pain management - they manage all of her meds      Relevant  Medications   buPROPion (WELLBUTRIN XL) 300 MG 24 hr tablet     Other   Obesity    Discussed importance of healthy weight management Discussed diet and exercise       MDD (major depressive disorder)    Chronic and fairly well controlled Has added counseling which is helpful Continue wellbutrin at current dose       Relevant Medications   buPROPion (WELLBUTRIN XL) 300 MG 24 hr tablet   Prediabetes    Recommend low carb diet Recheck A1c       Relevant Orders   Hemoglobin A1c   Mixed hyperlipidemia    Reviewed last lipid panel Not currently on a statin Recheck FLP and CMP Discussed diet and exercise       Relevant Orders   Lipid Panel With LDL/HDL Ratio   Other Visit Diagnoses     Encounter for annual physical exam    -  Primary   Relevant Orders   Comprehensive metabolic panel   Lipid Panel With LDL/HDL Ratio   Hemoglobin A1c   MM 3D SCREEN BREAST BILATERAL   Breast cancer screening by mammogram       Relevant Orders   MM 3D SCREEN BREAST BILATERAL   Need for shingles vaccine       Relevant Orders   Varicella-zoster vaccine IM        Return in about 1 year (around 05/03/2023) for CPE.     I, Lavon Paganini, MD, have reviewed all documentation for this visit. The documentation on 05/02/22 for the exam, diagnosis, procedures, and orders are all accurate and complete.   Elizebath Wever, Dionne Bucy, MD, MPH [facility]

## 2022-05-02 ENCOUNTER — Encounter: Payer: Self-pay | Admitting: Family Medicine

## 2022-05-02 ENCOUNTER — Ambulatory Visit: Payer: Medicaid Other | Admitting: Family Medicine

## 2022-05-02 ENCOUNTER — Telehealth: Payer: Self-pay

## 2022-05-02 VITALS — BP 121/86 | HR 87 | Temp 98.4°F | Resp 16 | Ht 64.0 in | Wt 181.7 lb

## 2022-05-02 DIAGNOSIS — E669 Obesity, unspecified: Secondary | ICD-10-CM

## 2022-05-02 DIAGNOSIS — G90523 Complex regional pain syndrome I of lower limb, bilateral: Secondary | ICD-10-CM

## 2022-05-02 DIAGNOSIS — Z23 Encounter for immunization: Secondary | ICD-10-CM

## 2022-05-02 DIAGNOSIS — Z0000 Encounter for general adult medical examination without abnormal findings: Secondary | ICD-10-CM

## 2022-05-02 DIAGNOSIS — R7303 Prediabetes: Secondary | ICD-10-CM

## 2022-05-02 DIAGNOSIS — E782 Mixed hyperlipidemia: Secondary | ICD-10-CM | POA: Diagnosis not present

## 2022-05-02 DIAGNOSIS — I480 Paroxysmal atrial fibrillation: Secondary | ICD-10-CM

## 2022-05-02 DIAGNOSIS — Z1231 Encounter for screening mammogram for malignant neoplasm of breast: Secondary | ICD-10-CM

## 2022-05-02 DIAGNOSIS — F3341 Major depressive disorder, recurrent, in partial remission: Secondary | ICD-10-CM | POA: Diagnosis not present

## 2022-05-02 DIAGNOSIS — Z6831 Body mass index (BMI) 31.0-31.9, adult: Secondary | ICD-10-CM

## 2022-05-02 MED ORDER — BUPROPION HCL ER (XL) 300 MG PO TB24: 300.0000 mg | ORAL_TABLET | Freq: Every day | ORAL | 0 refills | Status: DC

## 2022-05-02 NOTE — Assessment & Plan Note (Signed)
Chronic and fairly well controlled Has added counseling which is helpful Continue wellbutrin at current dose

## 2022-05-02 NOTE — Telephone Encounter (Unsigned)
Copied from [place] [id number]. Topic: General - Call Back - No Documentation >> May 02, 2022 12:49 PM Ja-Kwan M wrote: Reason for CRM: Pt stated she was returning the call from a missed call she had. Pt requests call back

## 2022-05-02 NOTE — Assessment & Plan Note (Signed)
Recommend low carb diet °Recheck A1c  °

## 2022-05-02 NOTE — Assessment & Plan Note (Signed)
Discussed importance of healthy weight management Discussed diet and exercise  

## 2022-05-02 NOTE — Assessment & Plan Note (Signed)
Currently in NSR F/b Cardiology Continue Diltiazem for rate control Not on anticoag

## 2022-05-02 NOTE — Assessment & Plan Note (Signed)
Reviewed last lipid panel Not currently on a statin Recheck FLP and CMP Discussed diet and exercise  

## 2022-05-02 NOTE — Assessment & Plan Note (Signed)
SCS in place F/b pain management - they manage all of her meds

## 2022-05-05 NOTE — Telephone Encounter (Signed)
No outgoing call document.

## 2022-05-08 ENCOUNTER — Telehealth: Payer: Self-pay | Admitting: Family Medicine

## 2022-05-08 DIAGNOSIS — R7303 Prediabetes: Secondary | ICD-10-CM | POA: Diagnosis not present

## 2022-05-08 DIAGNOSIS — E782 Mixed hyperlipidemia: Secondary | ICD-10-CM | POA: Diagnosis not present

## 2022-05-08 DIAGNOSIS — Z0000 Encounter for general adult medical examination without abnormal findings: Secondary | ICD-10-CM | POA: Diagnosis not present

## 2022-05-08 NOTE — Telephone Encounter (Signed)
Noted  

## 2022-05-08 NOTE — Telephone Encounter (Signed)
Pt states that she had the shingles vaccine on 05/02/2022 and that night she woke up with chills, severe pain in her lower back and body aches.  It lasted for about 12 hours.  For the next two days it was off and on.  Monday she had diarrhea and her stomach hurt really bad.  She states Tuesday she finally started feeling back to normal.    I asked her if she thought she just happened to get sick at that time and she states that she thinks it was because of the shingles vaccine.  She just wanted to let you know.

## 2022-05-09 DIAGNOSIS — F321 Major depressive disorder, single episode, moderate: Secondary | ICD-10-CM | POA: Diagnosis not present

## 2022-05-09 LAB — COMPREHENSIVE METABOLIC PANEL
ALT: 10 IU/L (ref 0–32)
AST: 12 IU/L (ref 0–40)
Albumin/Globulin Ratio: 1.5 (ref 1.2–2.2)
Albumin: 4.3 g/dL (ref 3.8–4.9)
Alkaline Phosphatase: 88 IU/L (ref 44–121)
BUN/Creatinine Ratio: 7 — ABNORMAL LOW (ref 9–23)
BUN: 7 mg/dL (ref 6–24)
Bilirubin Total: 0.2 mg/dL (ref 0.0–1.2)
CO2: 25 mmol/L (ref 20–29)
Calcium: 8.9 mg/dL (ref 8.7–10.2)
Chloride: 104 mmol/L (ref 96–106)
Creatinine, Ser: 1.01 mg/dL — ABNORMAL HIGH (ref 0.57–1.00)
Globulin, Total: 2.9 g/dL (ref 1.5–4.5)
Glucose: 102 mg/dL — ABNORMAL HIGH (ref 70–99)
Potassium: 4.7 mmol/L (ref 3.5–5.2)
Sodium: 142 mmol/L (ref 134–144)
Total Protein: 7.2 g/dL (ref 6.0–8.5)
eGFR: 65 mL/min/{1.73_m2} (ref >59)

## 2022-05-09 LAB — LIPID PANEL WITH LDL/HDL RATIO
Cholesterol, Total: 170 mg/dL (ref 100–199)
HDL: 35 mg/dL — ABNORMAL LOW (ref >39)
LDL Chol Calc (NIH): 108 mg/dL — ABNORMAL HIGH (ref 0–99)
LDL/HDL Ratio: 3.1 ratio (ref 0.0–3.2)
Triglycerides: 151 mg/dL — ABNORMAL HIGH (ref 0–149)
VLDL Cholesterol Cal: 27 mg/dL (ref 5–40)

## 2022-05-09 LAB — HEMOGLOBIN A1C
Est. average glucose Bld gHb Est-mCnc: 114 mg/dL
Hgb A1c MFr Bld: 5.6 % (ref 4.8–5.6)

## 2022-06-02 ENCOUNTER — Ambulatory Visit
Admission: RE | Admit: 2022-06-02 | Discharge: 2022-06-02 | Disposition: A | Discharge status: Home / Self Care | Payer: Medicaid Other | Source: Ambulatory Visit | Attending: Family Medicine | Admitting: Family Medicine

## 2022-06-02 DIAGNOSIS — M797 Fibromyalgia: Secondary | ICD-10-CM | POA: Diagnosis not present

## 2022-06-02 DIAGNOSIS — Z79891 Long term (current) use of opiate analgesic: Secondary | ICD-10-CM | POA: Diagnosis not present

## 2022-06-02 DIAGNOSIS — Z1231 Encounter for screening mammogram for malignant neoplasm of breast: Secondary | ICD-10-CM | POA: Insufficient documentation

## 2022-06-02 DIAGNOSIS — M5416 Radiculopathy, lumbar region: Secondary | ICD-10-CM | POA: Diagnosis not present

## 2022-06-02 DIAGNOSIS — G90529 Complex regional pain syndrome I of unspecified lower limb: Secondary | ICD-10-CM | POA: Diagnosis not present

## 2022-06-02 DIAGNOSIS — G8929 Other chronic pain: Secondary | ICD-10-CM | POA: Diagnosis not present

## 2022-06-02 DIAGNOSIS — F330 Major depressive disorder, recurrent, mild: Secondary | ICD-10-CM | POA: Diagnosis not present

## 2022-06-02 DIAGNOSIS — G894 Chronic pain syndrome: Secondary | ICD-10-CM | POA: Diagnosis not present

## 2022-06-02 DIAGNOSIS — Z79899 Other long term (current) drug therapy: Secondary | ICD-10-CM | POA: Diagnosis not present

## 2022-06-02 DIAGNOSIS — M542 Cervicalgia: Secondary | ICD-10-CM | POA: Diagnosis not present

## 2022-06-02 DIAGNOSIS — I4891 Unspecified atrial fibrillation: Secondary | ICD-10-CM | POA: Diagnosis not present

## 2022-06-27 DIAGNOSIS — F321 Major depressive disorder, single episode, moderate: Secondary | ICD-10-CM | POA: Diagnosis not present

## 2022-07-09 DIAGNOSIS — G894 Chronic pain syndrome: Secondary | ICD-10-CM | POA: Diagnosis not present

## 2022-07-09 DIAGNOSIS — M797 Fibromyalgia: Secondary | ICD-10-CM | POA: Diagnosis not present

## 2022-07-09 DIAGNOSIS — Z79891 Long term (current) use of opiate analgesic: Secondary | ICD-10-CM | POA: Diagnosis not present

## 2022-07-09 DIAGNOSIS — Z6830 Body mass index (BMI) 30.0-30.9, adult: Secondary | ICD-10-CM | POA: Diagnosis not present

## 2022-07-09 DIAGNOSIS — F330 Major depressive disorder, recurrent, mild: Secondary | ICD-10-CM | POA: Diagnosis not present

## 2022-07-09 DIAGNOSIS — I4891 Unspecified atrial fibrillation: Secondary | ICD-10-CM | POA: Diagnosis not present

## 2022-07-09 DIAGNOSIS — M542 Cervicalgia: Secondary | ICD-10-CM | POA: Diagnosis not present

## 2022-07-09 DIAGNOSIS — G90529 Complex regional pain syndrome I of unspecified lower limb: Secondary | ICD-10-CM | POA: Diagnosis not present

## 2022-07-09 DIAGNOSIS — M5416 Radiculopathy, lumbar region: Secondary | ICD-10-CM | POA: Diagnosis not present

## 2022-07-09 DIAGNOSIS — G8929 Other chronic pain: Secondary | ICD-10-CM | POA: Diagnosis not present

## 2022-07-09 DIAGNOSIS — Z79899 Other long term (current) drug therapy: Secondary | ICD-10-CM | POA: Diagnosis not present

## 2022-08-01 DIAGNOSIS — F321 Major depressive disorder, single episode, moderate: Secondary | ICD-10-CM | POA: Diagnosis not present

## 2022-09-03 DIAGNOSIS — I4891 Unspecified atrial fibrillation: Secondary | ICD-10-CM | POA: Diagnosis not present

## 2022-09-03 DIAGNOSIS — G894 Chronic pain syndrome: Secondary | ICD-10-CM | POA: Diagnosis not present

## 2022-09-03 DIAGNOSIS — Z79891 Long term (current) use of opiate analgesic: Secondary | ICD-10-CM | POA: Diagnosis not present

## 2022-09-03 DIAGNOSIS — G90529 Complex regional pain syndrome I of unspecified lower limb: Secondary | ICD-10-CM | POA: Diagnosis not present

## 2022-09-03 DIAGNOSIS — M542 Cervicalgia: Secondary | ICD-10-CM | POA: Diagnosis not present

## 2022-09-03 DIAGNOSIS — M797 Fibromyalgia: Secondary | ICD-10-CM | POA: Diagnosis not present

## 2022-09-03 DIAGNOSIS — F330 Major depressive disorder, recurrent, mild: Secondary | ICD-10-CM | POA: Diagnosis not present

## 2022-09-03 DIAGNOSIS — G8929 Other chronic pain: Secondary | ICD-10-CM | POA: Diagnosis not present

## 2022-09-03 DIAGNOSIS — Z79899 Other long term (current) drug therapy: Secondary | ICD-10-CM | POA: Diagnosis not present

## 2022-09-03 DIAGNOSIS — M5416 Radiculopathy, lumbar region: Secondary | ICD-10-CM | POA: Diagnosis not present

## 2022-09-03 DIAGNOSIS — A925 Zika virus disease: Secondary | ICD-10-CM | POA: Diagnosis not present

## 2022-09-05 DIAGNOSIS — F321 Major depressive disorder, single episode, moderate: Secondary | ICD-10-CM | POA: Diagnosis not present

## 2022-09-08 ENCOUNTER — Other Ambulatory Visit: Payer: Self-pay | Admitting: Family Medicine

## 2022-10-01 DIAGNOSIS — M542 Cervicalgia: Secondary | ICD-10-CM | POA: Diagnosis not present

## 2022-10-01 DIAGNOSIS — I4891 Unspecified atrial fibrillation: Secondary | ICD-10-CM | POA: Diagnosis not present

## 2022-10-01 DIAGNOSIS — M5416 Radiculopathy, lumbar region: Secondary | ICD-10-CM | POA: Diagnosis not present

## 2022-10-01 DIAGNOSIS — Z79899 Other long term (current) drug therapy: Secondary | ICD-10-CM | POA: Diagnosis not present

## 2022-10-01 DIAGNOSIS — G894 Chronic pain syndrome: Secondary | ICD-10-CM | POA: Diagnosis not present

## 2022-10-01 DIAGNOSIS — F330 Major depressive disorder, recurrent, mild: Secondary | ICD-10-CM | POA: Diagnosis not present

## 2022-10-01 DIAGNOSIS — G90529 Complex regional pain syndrome I of unspecified lower limb: Secondary | ICD-10-CM | POA: Diagnosis not present

## 2022-10-01 DIAGNOSIS — Z79891 Long term (current) use of opiate analgesic: Secondary | ICD-10-CM | POA: Diagnosis not present

## 2022-10-01 DIAGNOSIS — M797 Fibromyalgia: Secondary | ICD-10-CM | POA: Diagnosis not present

## 2022-10-01 DIAGNOSIS — G8929 Other chronic pain: Secondary | ICD-10-CM | POA: Diagnosis not present

## 2022-10-21 DIAGNOSIS — M797 Fibromyalgia: Secondary | ICD-10-CM | POA: Diagnosis not present

## 2022-10-21 DIAGNOSIS — G9050 Complex regional pain syndrome I, unspecified: Secondary | ICD-10-CM | POA: Diagnosis not present

## 2022-10-21 DIAGNOSIS — I480 Paroxysmal atrial fibrillation: Secondary | ICD-10-CM | POA: Diagnosis not present

## 2022-11-05 DIAGNOSIS — G90529 Complex regional pain syndrome I of unspecified lower limb: Secondary | ICD-10-CM | POA: Diagnosis not present

## 2022-11-05 DIAGNOSIS — Z6830 Body mass index (BMI) 30.0-30.9, adult: Secondary | ICD-10-CM | POA: Diagnosis not present

## 2022-11-05 DIAGNOSIS — M542 Cervicalgia: Secondary | ICD-10-CM | POA: Diagnosis not present

## 2022-11-05 DIAGNOSIS — G894 Chronic pain syndrome: Secondary | ICD-10-CM | POA: Diagnosis not present

## 2022-11-05 DIAGNOSIS — M797 Fibromyalgia: Secondary | ICD-10-CM | POA: Diagnosis not present

## 2022-11-05 DIAGNOSIS — Z79891 Long term (current) use of opiate analgesic: Secondary | ICD-10-CM | POA: Diagnosis not present

## 2022-11-05 DIAGNOSIS — I4891 Unspecified atrial fibrillation: Secondary | ICD-10-CM | POA: Diagnosis not present

## 2022-11-05 DIAGNOSIS — Z79899 Other long term (current) drug therapy: Secondary | ICD-10-CM | POA: Diagnosis not present

## 2022-11-05 DIAGNOSIS — G8929 Other chronic pain: Secondary | ICD-10-CM | POA: Diagnosis not present

## 2022-11-05 DIAGNOSIS — M5416 Radiculopathy, lumbar region: Secondary | ICD-10-CM | POA: Diagnosis not present

## 2022-11-05 DIAGNOSIS — F330 Major depressive disorder, recurrent, mild: Secondary | ICD-10-CM | POA: Diagnosis not present

## 2022-11-17 ENCOUNTER — Emergency Department: Payer: Medicaid Other

## 2022-11-17 ENCOUNTER — Emergency Department
Admission: EM | Admit: 2022-11-17 | Discharge: 2022-11-17 | Disposition: A | Discharge status: Home / Self Care | Payer: Medicaid Other | Attending: Emergency Medicine | Admitting: Emergency Medicine

## 2022-11-17 ENCOUNTER — Other Ambulatory Visit: Payer: Self-pay

## 2022-11-17 DIAGNOSIS — Y998 Other external cause status: Secondary | ICD-10-CM | POA: Insufficient documentation

## 2022-11-17 DIAGNOSIS — Z8541 Personal history of malignant neoplasm of cervix uteri: Secondary | ICD-10-CM | POA: Insufficient documentation

## 2022-11-17 DIAGNOSIS — S99921A Unspecified injury of right foot, initial encounter: Secondary | ICD-10-CM

## 2022-11-17 DIAGNOSIS — W228XXA Striking against or struck by other objects, initial encounter: Secondary | ICD-10-CM | POA: Insufficient documentation

## 2022-11-17 DIAGNOSIS — Y92019 Unspecified place in single-family (private) house as the place of occurrence of the external cause: Secondary | ICD-10-CM | POA: Diagnosis not present

## 2022-11-17 DIAGNOSIS — Y9301 Activity, walking, marching and hiking: Secondary | ICD-10-CM | POA: Diagnosis not present

## 2022-11-17 NOTE — ED Provider Notes (Signed)
[facility] Provider Note    Event Date/Time   First MD Initiated Contact with Patient 11/17/22 1413     (approximate)   History   Toe Injury   HPI  Julie Berger is a 56 y.o. female medical history of A-fib not anticoagulated, fibromyalgia and complex regional pain syndrome who presents with right toe pain.  Yesterday patient was walking outside of her house and stubbed the right toe on her root of a tree.  Did not fall.  Since then she has had pain and swelling of the right great toe.  She typically walks with either a cane or walker due to her fibromyalgia she is still been able to get around despite her toe hurting.     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   Other foreign object in esophagus causing other injury, initial encounter     Patient Active Problem List   Diagnosis Date Noted   Mixed hyperlipidemia 10/26/2020   Prediabetes 07/24/2020   Other chest pain    Encounter for long-term opiate analgesic use 01/18/2020   Gastroesophageal reflux disease with esophagitis 11/03/2019   Chronic pain 11/02/2019   History of cervical cancer 11/02/2019   Fibromyalgia 11/02/2019   Obesity 11/02/2019   Not currently working due to disabled status 11/02/2019   MDD (major depressive disorder) 11/02/2019   Complex regional pain syndrome I 11/02/2019   Paroxysmal atrial fibrillation ([place]) 11/02/2019   Mechanical complication of dorsal column stimulator ([place]) 11/11/2018     Physical Exam  Triage Vital Signs: ED Triage Vitals  Enc Vitals Group     BP 11/17/22 1308 (!) 127/92     Pulse Rate 11/17/22 1308 90     Resp 11/17/22 1308 16     Temp 11/17/22 1308 98.2 F (36.8 C)     Temp Source 11/17/22  1308 Oral     SpO2 11/17/22 1308 94 %     Weight 11/17/22 1312 174 lb (78.9 kg)     Height 11/17/22 1312 '5\' 4"'$  (1.626 m)     Head Circumference --      Peak Flow --      Pain Score 11/17/22 1311 6     Pain Loc --      Pain Edu? --      Excl. in [place]? --     Most recent vital signs: Vitals:   11/17/22 1308  BP: (!) 127/92  Pulse: 90  Resp: 16  Temp: 98.2 F (36.8 C)  SpO2: 94%     General: Awake, no distress.  CV:  Good peripheral perfusion.  Resp:  Normal effort.  Abd:  No distention.  Neuro:             Awake, Alert, Oriented x 3  Other:  Mild ecchymosis and swelling of the right great toe, tenderness to palpation over the right MTP, no open wound, no tenderness over the rest of the foot or ankle, 2+ DP puls   ED Results / Procedures / Treatments  Labs (all labs ordered are listed, but only abnormal results are displayed) Labs Reviewed - No data to display   EKG     RADIOLOGY Reviewed and  interpreted the x-ray of the right foot which is negative for fracture   PROCEDURES:  Critical Care performed: No  Procedures   MEDICATIONS ORDERED IN ED: Medications - No data to display   IMPRESSION / MDM / [place] / ED COURSE  I reviewed the triage vital signs and the nursing notes.                              Patient's presentation is most consistent with acute, uncomplicated illness.  Differential diagnosis includes, but is not limited to, toe fracture, dislocation, sprain, crush injury, contusion  Is a 56 year old female presenting with pain of the right great toe after stubbing the toe yesterday while walking outside her house.  Denies other injuries.  She typically walks with a cane or walker because of her underlying fibromyalgia.  On exam she does have some swelling and mild ecchymosis on the toe with some tenderness over the right MTP otherwise she is nontender throughout the rest of the foot and ankle.  X-ray obtained is negative for fracture  or dislocation.  Suspect sprain.  Discussed RICE Tylenol and NSAIDs for pain.  She is appropriate for discharge.       FINAL CLINICAL IMPRESSION(S) / ED DIAGNOSES   Final diagnoses:  Injury of toe on right foot, initial encounter     Rx / DC Orders   ED Discharge Orders     None        Note:  This document was prepared using Dragon voice recognition software and may include unintentional dictation errors.   Rada Hay, MD 11/17/22 [phone]

## 2022-11-17 NOTE — ED Triage Notes (Signed)
Pt to ED with daughter POV from home. Pt was walking outside and 2 days ago, hit R great toe on tree root that was sticking up. R great toe appears slightly bruised and swollen. Pt asking for xray to make sure not broken. Hx fibromyalgia, chronic pain and uses walker at baseline.

## 2022-11-17 NOTE — Discharge Instructions (Addendum)
Your x-ray did not show any broken bone.  You likely sprained the toe.  Please elevate your foot you can ice it and take Tylenol Motrin for pain.

## 2022-11-21 DIAGNOSIS — F321 Major depressive disorder, single episode, moderate: Secondary | ICD-10-CM | POA: Diagnosis not present

## 2022-12-03 DIAGNOSIS — M797 Fibromyalgia: Secondary | ICD-10-CM | POA: Diagnosis not present

## 2022-12-03 DIAGNOSIS — I4891 Unspecified atrial fibrillation: Secondary | ICD-10-CM | POA: Diagnosis not present

## 2022-12-03 DIAGNOSIS — M542 Cervicalgia: Secondary | ICD-10-CM | POA: Diagnosis not present

## 2022-12-03 DIAGNOSIS — F330 Major depressive disorder, recurrent, mild: Secondary | ICD-10-CM | POA: Diagnosis not present

## 2022-12-03 DIAGNOSIS — G90529 Complex regional pain syndrome I of unspecified lower limb: Secondary | ICD-10-CM | POA: Diagnosis not present

## 2022-12-03 DIAGNOSIS — Z79891 Long term (current) use of opiate analgesic: Secondary | ICD-10-CM | POA: Diagnosis not present

## 2022-12-12 DIAGNOSIS — F331 Major depressive disorder, recurrent, moderate: Secondary | ICD-10-CM | POA: Diagnosis not present

## 2022-12-24 DIAGNOSIS — I4891 Unspecified atrial fibrillation: Secondary | ICD-10-CM | POA: Diagnosis not present

## 2022-12-24 DIAGNOSIS — M797 Fibromyalgia: Secondary | ICD-10-CM | POA: Diagnosis not present

## 2022-12-24 DIAGNOSIS — F330 Major depressive disorder, recurrent, mild: Secondary | ICD-10-CM | POA: Diagnosis not present

## 2022-12-24 DIAGNOSIS — G894 Chronic pain syndrome: Secondary | ICD-10-CM | POA: Diagnosis not present

## 2022-12-24 DIAGNOSIS — Z6830 Body mass index (BMI) 30.0-30.9, adult: Secondary | ICD-10-CM | POA: Diagnosis not present

## 2022-12-24 DIAGNOSIS — G90529 Complex regional pain syndrome I of unspecified lower limb: Secondary | ICD-10-CM | POA: Diagnosis not present

## 2022-12-24 DIAGNOSIS — M79674 Pain in right toe(s): Secondary | ICD-10-CM | POA: Diagnosis not present

## 2022-12-24 DIAGNOSIS — M542 Cervicalgia: Secondary | ICD-10-CM | POA: Diagnosis not present

## 2022-12-24 DIAGNOSIS — M5416 Radiculopathy, lumbar region: Secondary | ICD-10-CM | POA: Diagnosis not present

## 2022-12-24 DIAGNOSIS — Z79891 Long term (current) use of opiate analgesic: Secondary | ICD-10-CM | POA: Diagnosis not present

## 2023-01-28 DIAGNOSIS — I4891 Unspecified atrial fibrillation: Secondary | ICD-10-CM | POA: Diagnosis not present

## 2023-01-28 DIAGNOSIS — Z79891 Long term (current) use of opiate analgesic: Secondary | ICD-10-CM | POA: Diagnosis not present

## 2023-01-28 DIAGNOSIS — M542 Cervicalgia: Secondary | ICD-10-CM | POA: Diagnosis not present

## 2023-01-28 DIAGNOSIS — G894 Chronic pain syndrome: Secondary | ICD-10-CM | POA: Diagnosis not present

## 2023-01-28 DIAGNOSIS — G90529 Complex regional pain syndrome I of unspecified lower limb: Secondary | ICD-10-CM | POA: Diagnosis not present

## 2023-01-28 DIAGNOSIS — M797 Fibromyalgia: Secondary | ICD-10-CM | POA: Diagnosis not present

## 2023-01-28 DIAGNOSIS — Z6830 Body mass index (BMI) 30.0-30.9, adult: Secondary | ICD-10-CM | POA: Diagnosis not present

## 2023-01-28 DIAGNOSIS — M79674 Pain in right toe(s): Secondary | ICD-10-CM | POA: Diagnosis not present

## 2023-01-28 DIAGNOSIS — Z79899 Other long term (current) drug therapy: Secondary | ICD-10-CM | POA: Diagnosis not present

## 2023-01-28 DIAGNOSIS — M5416 Radiculopathy, lumbar region: Secondary | ICD-10-CM | POA: Diagnosis not present

## 2023-01-28 DIAGNOSIS — F330 Major depressive disorder, recurrent, mild: Secondary | ICD-10-CM | POA: Diagnosis not present

## 2023-03-02 ENCOUNTER — Other Ambulatory Visit: Payer: Self-pay | Admitting: Family Medicine

## 2023-03-02 DIAGNOSIS — M5416 Radiculopathy, lumbar region: Secondary | ICD-10-CM | POA: Diagnosis not present

## 2023-03-02 DIAGNOSIS — Z6830 Body mass index (BMI) 30.0-30.9, adult: Secondary | ICD-10-CM | POA: Diagnosis not present

## 2023-03-02 DIAGNOSIS — M542 Cervicalgia: Secondary | ICD-10-CM | POA: Diagnosis not present

## 2023-03-02 DIAGNOSIS — Z79891 Long term (current) use of opiate analgesic: Secondary | ICD-10-CM | POA: Diagnosis not present

## 2023-03-02 DIAGNOSIS — G894 Chronic pain syndrome: Secondary | ICD-10-CM | POA: Diagnosis not present

## 2023-03-02 DIAGNOSIS — F330 Major depressive disorder, recurrent, mild: Secondary | ICD-10-CM | POA: Diagnosis not present

## 2023-03-02 DIAGNOSIS — I4891 Unspecified atrial fibrillation: Secondary | ICD-10-CM | POA: Diagnosis not present

## 2023-03-02 DIAGNOSIS — G90529 Complex regional pain syndrome I of unspecified lower limb: Secondary | ICD-10-CM | POA: Diagnosis not present

## 2023-03-02 DIAGNOSIS — M797 Fibromyalgia: Secondary | ICD-10-CM | POA: Diagnosis not present

## 2023-03-02 DIAGNOSIS — M79674 Pain in right toe(s): Secondary | ICD-10-CM | POA: Diagnosis not present

## 2023-03-03 NOTE — Telephone Encounter (Signed)
Requested Prescriptions  Pending Prescriptions Disp Refills   buPROPion (WELLBUTRIN XL) 300 MG 24 hr tablet [Pharmacy Med Name: buPROPion HCl ER (XL) 300 MG Oral Tablet Extended Release 24 Hour] 90 tablet 0    Sig: Take 1 tablet by mouth once daily     Psychiatry: Antidepressants - bupropion Failed - 03/02/2023 11:36 AM      Failed - Cr in normal range and within 360 days    Creatinine, Ser  Date Value Ref Range Status  05/08/2022 1.01 (H) 0.57 - 1.00 mg/dL Final         Failed - Last BP in normal range    BP Readings from Last 1 Encounters:  11/17/22 (!) 127/92         Failed - Valid encounter within last 6 months    Recent Outpatient Visits           10 months ago Encounter for annual physical exam   [place] [facility] [place], Marzella Schlein, MD   1 year ago Recurrent major depressive disorder, in partial remission [facility])   [place] [facility] [place], Marzella Schlein, MD   2 years ago Encounter for annual physical exam   [place] [facility] [place], Marzella Schlein, MD   2 years ago Prediabetes   [facility] [place], Marzella Schlein, MD   2 years ago Prediabetes   [place] [facility] [employer], Marzella Schlein, MD              Passed - AST in normal range and within 360 days    AST  Date Value Ref Range Status  05/08/2022 12 0 - 40 IU/L Final         Passed - ALT in normal range and within 360 days    ALT  Date Value Ref Range Status  05/08/2022 10 0 - 32 IU/L Final         Passed - Completed PHQ-2 or PHQ-9 in the last 360 days       Courtesy refill.

## 2023-03-03 NOTE — Telephone Encounter (Signed)
Overdue encounter. Patient needs appt scheduled for further refills

## 2023-03-09 DIAGNOSIS — Z79891 Long term (current) use of opiate analgesic: Secondary | ICD-10-CM | POA: Diagnosis not present

## 2023-03-29 ENCOUNTER — Other Ambulatory Visit: Payer: Self-pay | Admitting: Family Medicine

## 2023-04-21 DIAGNOSIS — I480 Paroxysmal atrial fibrillation: Secondary | ICD-10-CM | POA: Diagnosis not present

## 2023-04-21 DIAGNOSIS — G894 Chronic pain syndrome: Secondary | ICD-10-CM | POA: Diagnosis not present

## 2023-04-27 DIAGNOSIS — Z79891 Long term (current) use of opiate analgesic: Secondary | ICD-10-CM | POA: Diagnosis not present

## 2023-04-27 DIAGNOSIS — M797 Fibromyalgia: Secondary | ICD-10-CM | POA: Diagnosis not present

## 2023-04-27 DIAGNOSIS — Z6829 Body mass index (BMI) 29.0-29.9, adult: Secondary | ICD-10-CM | POA: Diagnosis not present

## 2023-04-27 DIAGNOSIS — M542 Cervicalgia: Secondary | ICD-10-CM | POA: Diagnosis not present

## 2023-04-27 DIAGNOSIS — F330 Major depressive disorder, recurrent, mild: Secondary | ICD-10-CM | POA: Diagnosis not present

## 2023-04-27 DIAGNOSIS — M79674 Pain in right toe(s): Secondary | ICD-10-CM | POA: Diagnosis not present

## 2023-04-27 DIAGNOSIS — I4891 Unspecified atrial fibrillation: Secondary | ICD-10-CM | POA: Diagnosis not present

## 2023-04-27 DIAGNOSIS — G90529 Complex regional pain syndrome I of unspecified lower limb: Secondary | ICD-10-CM | POA: Diagnosis not present

## 2023-04-27 DIAGNOSIS — G894 Chronic pain syndrome: Secondary | ICD-10-CM | POA: Diagnosis not present

## 2023-04-27 DIAGNOSIS — M5416 Radiculopathy, lumbar region: Secondary | ICD-10-CM | POA: Diagnosis not present

## 2023-05-06 DIAGNOSIS — F321 Major depressive disorder, single episode, moderate: Secondary | ICD-10-CM | POA: Diagnosis not present

## 2023-05-18 DIAGNOSIS — M797 Fibromyalgia: Secondary | ICD-10-CM | POA: Diagnosis not present

## 2023-05-18 DIAGNOSIS — G894 Chronic pain syndrome: Secondary | ICD-10-CM | POA: Diagnosis not present

## 2023-05-18 DIAGNOSIS — Z79891 Long term (current) use of opiate analgesic: Secondary | ICD-10-CM | POA: Diagnosis not present

## 2023-05-18 DIAGNOSIS — Z6829 Body mass index (BMI) 29.0-29.9, adult: Secondary | ICD-10-CM | POA: Diagnosis not present

## 2023-05-18 DIAGNOSIS — G90529 Complex regional pain syndrome I of unspecified lower limb: Secondary | ICD-10-CM | POA: Diagnosis not present

## 2023-05-18 DIAGNOSIS — M5416 Radiculopathy, lumbar region: Secondary | ICD-10-CM | POA: Diagnosis not present

## 2023-05-18 DIAGNOSIS — M79674 Pain in right toe(s): Secondary | ICD-10-CM | POA: Diagnosis not present

## 2023-05-18 DIAGNOSIS — I4891 Unspecified atrial fibrillation: Secondary | ICD-10-CM | POA: Diagnosis not present

## 2023-05-18 DIAGNOSIS — F330 Major depressive disorder, recurrent, mild: Secondary | ICD-10-CM | POA: Diagnosis not present

## 2023-05-18 DIAGNOSIS — M542 Cervicalgia: Secondary | ICD-10-CM | POA: Diagnosis not present

## 2023-05-19 ENCOUNTER — Ambulatory Visit: Payer: Medicaid Other | Admitting: Family Medicine

## 2023-05-19 ENCOUNTER — Encounter: Payer: Self-pay | Admitting: Family Medicine

## 2023-05-19 VITALS — BP 99/69 | HR 78 | Temp 98.2°F | Resp 12 | Ht 64.0 in | Wt 175.0 lb

## 2023-05-19 DIAGNOSIS — R7303 Prediabetes: Secondary | ICD-10-CM

## 2023-05-19 DIAGNOSIS — Z6830 Body mass index (BMI) 30.0-30.9, adult: Secondary | ICD-10-CM

## 2023-05-19 DIAGNOSIS — Z1211 Encounter for screening for malignant neoplasm of colon: Secondary | ICD-10-CM | POA: Diagnosis not present

## 2023-05-19 DIAGNOSIS — I480 Paroxysmal atrial fibrillation: Secondary | ICD-10-CM | POA: Diagnosis not present

## 2023-05-19 DIAGNOSIS — Z01818 Encounter for other preprocedural examination: Secondary | ICD-10-CM | POA: Diagnosis not present

## 2023-05-19 DIAGNOSIS — Z1231 Encounter for screening mammogram for malignant neoplasm of breast: Secondary | ICD-10-CM

## 2023-05-19 DIAGNOSIS — Z0000 Encounter for general adult medical examination without abnormal findings: Secondary | ICD-10-CM | POA: Diagnosis not present

## 2023-05-19 DIAGNOSIS — E669 Obesity, unspecified: Secondary | ICD-10-CM

## 2023-05-19 DIAGNOSIS — F3341 Major depressive disorder, recurrent, in partial remission: Secondary | ICD-10-CM | POA: Diagnosis not present

## 2023-05-19 DIAGNOSIS — E782 Mixed hyperlipidemia: Secondary | ICD-10-CM

## 2023-05-19 MED ORDER — BUPROPION HCL ER (XL) 300 MG PO TB24: ORAL_TABLET | ORAL | 3 refills | Status: DC

## 2023-05-19 NOTE — Assessment & Plan Note (Signed)
Discussed importance of healthy weight management Discussed diet and exercise  

## 2023-05-19 NOTE — Progress Notes (Signed)
I,Sulibeya S Dimas,acting as a Neurosurgeon for Shirlee Latch, MD.,have documented all relevant documentation on the behalf of Shirlee Latch, MD,as directed by  Shirlee Latch, MD while in the presence of Shirlee Latch, MD.   Complete physical exam   Patient: Julie Berger   DOB: 09/08/1966   57 y.o. Female  MRN: [id number] Visit Date: 05/19/2023  Today's healthcare provider: Shirlee Latch, MD   Chief Complaint  Patient presents with   Annual Exam   Subjective    Julie Berger is a 57 y.o. female who presents today for a complete physical exam.  She reports consuming a general diet. Gym/ health club routine includes pool. She generally feels fairly well. She reports sleeping poorly. She does have additional problems to discuss today.  HPI   Discussed the use of AI scribe software for clinical note transcription with the patient, who gave verbal consent to proceed.  History of Present Illness   The patient presents for a physical and clearance for dental surgery. She has a history of atrial fibrillation (AFib) but is not on any blood thinners. The last episode of AFib was a couple of months ago, around the time of her mother's death. She spent a month in [place] and has not had an episode since returning. She is currently on Diltiazem for rate control.  The patient also has a blackhead on her back, which is causing discomfort. She has been advised to use warm compresses to help it work itself out.  She is due for a colon cancer screening, having had a Cologuard three years ago. She is also due for a mammogram, having had one almost a year ago. She has completed her shingles and tetanus shots and is due for a flu shot in the fall.  The patient is also on Wellbutrin for mood, which she reports is going well. She has a history of reflex dystrophy and fibromyalgia.        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   Other foreign object in esophagus causing other injury, initial encounter    Past Surgical History:  Procedure Laterality Date   BACK SURGERY     x 3   CHOLECYSTECTOMY     ESOPHAGOGASTRODUODENOSCOPY (EGD) WITH PROPOFOL N/A 02/02/2020   Procedure: ESOPHAGOGASTRODUODENOSCOPY (EGD) WITH PROPOFOL;  Surgeon: Midge Minium, MD;  Location: [facility] ENDOSCOPY;  Service: Endoscopy;  Laterality: N/A;   HIATAL HERNIA REPAIR     HYSTERECTOMY ABDOMINAL WITH SALPINGO-OOPHORECTOMY     KNEE ARTHROSCOPY Left    SPINAL CORD STIMULATOR IMPLANT     TONSILLECTOMY     TRANSORAL INCISIONLESS FUNDOPLICATION     ULNAR NERVE TRANSPOSITION Left    Social History   Socioeconomic History   Marital status: Divorced    Spouse name: Not on file   Number of children: 6   Years of education: Not on file   Highest education level: Not on file  Occupational History   Occupation: Disabiled  Tobacco Use   Smoking status: Former    Packs/day: 1.00    Years: 20.00    Additional pack years: 0.00    Total pack years: 20.00    Types: Cigarettes    Quit date:  08/22/1999    Years since quitting: 23.7   Smokeless tobacco: Never  Vaping Use   Vaping Use: Never used  Substance and Sexual Activity   Alcohol use: Never   Drug use: Never   Sexual activity: Never  Other Topics Concern   Not on file  Social History Narrative   Not on file   Social Determinants of Health   Financial Resource Strain: Not on file  Food Insecurity: Not on file  Transportation Needs: Not on file  Physical Activity: Not on file  Stress: Not on file  Social Connections: Not on file  Intimate Partner Violence: Not on file   Family Status  Relation Name Status   Father  Alive   Mother  Alive   Brother  Alive   Sister  Deceased   Daughter  Alive    Son  Alive   Brother  Deceased   Brother  Alive   Daughter  Alive   Son  Alive   Son  Alive   Son  Alive   Neg Hx  (Not Specified)   Family History  Problem Relation Age of Onset   Heart attack Father    Mitral valve prolapse Father    Hypertension Father    Dementia Mother    Lupus Mother    Fibromyalgia Mother    Diabetes Brother    Cancer Brother        unknown type   Bone cancer Brother    Colon cancer Neg Hx    Breast cancer Neg Hx    Allergies  Allergen Reactions   Nsaids Shortness Of Breath    Patient Care Team: Erasmo Downer, MD as PCP - General (Family Medicine)   Medications: Outpatient Medications Prior to Visit  Medication Sig   diltiazem (CARDIZEM CD) 120 MG 24 hr capsule Take 120 mg by mouth daily.   HM LIDOCAINE PATCH EX Apply 1 patch topically daily.   naloxone (NARCAN) 4 MG/0.1ML LIQD nasal spray kit USE UTD FOR SUSPECTED OVERDOSE.   oxyCODONE ER (XTAMPZA ER) 9 MG C12A Take by mouth 2 (two) times daily.   pregabalin (LYRICA) 150 MG capsule Take 1 capsule (150 mg total) by mouth 3 (three) times daily.   [DISCONTINUED] buPROPion (WELLBUTRIN XL) 300 MG 24 hr tablet TAKE 1 TABLET BY MOUTH ONCE DAILY.  PATIENT NEEDS APPOINTMENT FOR FURTHER REFILLS   [DISCONTINUED] dronabinol (MARINOL) 2.5 MG capsule Take 2.5 mg by mouth daily as needed. (Patient not taking: Reported on 05/19/2023)   No facility-administered medications prior to visit.    Review of Systems  Constitutional:  Positive for fatigue.  Eyes:  Positive for photophobia.  Gastrointestinal:  Positive for abdominal distention.  Endocrine: Positive for heat intolerance and polydipsia.  Musculoskeletal:  Positive for arthralgias, back pain, joint swelling, myalgias and neck pain.  Allergic/Immunologic: Positive for environmental allergies and food allergies.  Neurological:  Positive for dizziness, light-headedness and headaches.  Psychiatric/Behavioral:  Positive for sleep disturbance. The  patient is nervous/anxious.   All other systems reviewed and are negative.     Objective    BP 99/69 (BP Location: Left Arm, Patient Position: Sitting, Cuff Size: Normal)   Pulse 78   Temp 98.2 F (36.8 C) (Temporal)   Resp 12   Ht 5\' 4"  (1.626 m)   Wt 175 lb (79.4 kg)   SpO2 98%   BMI 30.04 kg/m  BP Readings from Last 3 Encounters:  05/19/23 99/69  11/17/22 (!) 127/92  05/02/22 121/86  Wt Readings from Last 3 Encounters:  05/19/23 175 lb (79.4 kg)  11/17/22 174 lb (78.9 kg)  05/02/22 181 lb 11.2 oz (82.4 kg)       Physical Exam Vitals reviewed.  Constitutional:      General: She is not in acute distress.    Appearance: Normal appearance. She is well-developed. She is not diaphoretic.  HENT:     Head: Normocephalic and atraumatic.     Right Ear: Tympanic membrane, ear canal and external ear normal.     Left Ear: Tympanic membrane, ear canal and external ear normal.     Nose: Nose normal.     Mouth/Throat:     Mouth: Mucous membranes are moist.     Pharynx: Oropharynx is clear. No oropharyngeal exudate.  Eyes:     General: No scleral icterus.    Conjunctiva/sclera: Conjunctivae normal.     Pupils: Pupils are equal, round, and reactive to light.  Neck:     Thyroid: No thyromegaly.  Cardiovascular:     Rate and Rhythm: Normal rate and regular rhythm.     Heart sounds: Normal heart sounds. No murmur heard. Pulmonary:     Effort: Pulmonary effort is normal. No respiratory distress.     Breath sounds: Normal breath sounds. No wheezing or rales.  Abdominal:     General: There is no distension.     Palpations: Abdomen is soft.     Tenderness: There is no abdominal tenderness.  Musculoskeletal:        General: No deformity.     Cervical back: Neck supple.     Right lower leg: No edema.     Left lower leg: No edema.  Lymphadenopathy:     Cervical: No cervical adenopathy.  Skin:    General: Skin is warm and dry.     Findings: No rash.  Neurological:      Mental Status: She is alert and oriented to person, place, and time. Mental status is at baseline.     Gait: Gait normal.  Psychiatric:        Mood and Affect: Mood normal.        Behavior: Behavior normal.        Thought Content: Thought content normal.     Last depression screening scores    05/19/2023    3:38 PM 05/02/2022   11:27 AM 09/06/2021   10:36 AM  PHQ 2/9 Scores  PHQ - 2 Score 2 1 2   PHQ- 9 Score 5 3 7    Last fall risk screening    05/19/2023    3:37 PM  Fall Risk   Falls in the past year? 0  Number falls in past yr: 0  Injury with Fall? 0  Risk for fall due to : No Fall Risks  Follow up Falls evaluation completed   Last Audit-C alcohol use screening    05/19/2023    3:39 PM  Alcohol Use Disorder Test (AUDIT)  1. How often do you have a drink containing alcohol? 0  2. How many drinks containing alcohol do you have on a typical day when you are drinking? 0  3. How often do you have six or more drinks on one occasion? 0  AUDIT-C Score 0   A score of 3 or more in women, and 4 or more in men indicates increased risk for alcohol abuse, EXCEPT if all of the points are from question 1   No results found for any visits on 05/19/23.  Assessment & Plan    Routine Health Maintenance and Physical Exam  Exercise Activities and Dietary recommendations  Goals   None     Immunization History  Administered Date(s) Administered   Influenza,inj,Quad PF,6+ Mos 11/11/2019, 07/24/2020, 09/06/2021   Moderna Sars-Covid-2 Vaccination 03/17/2020, 04/14/2020   Tdap 02/17/2020   Zoster Recombinat (Shingrix) 02/17/2020, 05/02/2022    Health Maintenance  Topic Date Due   COVID-19 Vaccine (3 - 2023-24 season) 07/25/2022   Fecal DNA (Cologuard)  12/28/2022   INFLUENZA VACCINE  06/25/2023   MAMMOGRAM  06/02/2024   DTaP/Tdap/Td (2 - Td or Tdap) 02/16/2030   Hepatitis C Screening  Completed   HIV Screening  Completed   Zoster Vaccines- Shingrix  Completed   HPV VACCINES   Aged Out    Discussed health benefits of physical activity, and encouraged her to engage in regular exercise appropriate for her age and condition.  Problem List Items Addressed This Visit       Cardiovascular and Mediastinum   Paroxysmal atrial fibrillation (HCC)    Currently in NSR F/b Cardiology Continue Diltiazem for rate control Not on anticoag        Other   Obesity    Discussed importance of healthy weight management Discussed diet and exercise       MDD (major depressive disorder)    Chronic and fairly well controlled Has added counseling which is helpful Continue wellbutrin at current dose       Relevant Medications   buPROPion (WELLBUTRIN XL) 300 MG 24 hr tablet   Prediabetes    Recommend low carb diet Recheck A1c       Relevant Orders   Hemoglobin A1c   Mixed hyperlipidemia    Reviewed last lipid panel Not currently on a statin Recheck FLP and CMP Discussed diet and exercise       Relevant Orders   Lipid panel   Comprehensive metabolic panel   Other Visit Diagnoses     Encounter for annual physical exam    -  Primary   Relevant Orders   Hemoglobin A1c   Lipid panel   Comprehensive metabolic panel   Pre-op evaluation       Colon cancer screening       Relevant Orders   Cologuard   Breast cancer screening by mammogram       Relevant Orders   MM 3D SCREENING MAMMOGRAM BILATERAL BREAST           Atrial Fibrillation: Stable on Diltiazem. Last episode a few months ago, likely stress-related. No current anticoagulation. -Continue Diltiazem. -Cleared for dental surgery.  Colon Cancer Screening: Due for screening. Last Cologuard was negative. -Order Cologuard.  Breast Cancer Screening: Last mammogram was almost a year ago. -Order mammogram.       Return in about 6 months (around 11/18/2023) for chronic disease f/u.     I, Shirlee Latch, MD, have reviewed all documentation for this visit. The documentation on 05/19/23 for the  exam, diagnosis, procedures, and orders are all accurate and complete.   Julie Berger, Marzella Schlein, MD, MPH [facility]

## 2023-05-19 NOTE — Assessment & Plan Note (Signed)
Recommend low carb diet °Recheck A1c  °

## 2023-05-19 NOTE — Assessment & Plan Note (Signed)
Currently in NSR F/b Cardiology Continue Diltiazem for rate control Not on anticoag 

## 2023-05-19 NOTE — Assessment & Plan Note (Signed)
Reviewed last lipid panel Not currently on a statin Recheck FLP and CMP Discussed diet and exercise  

## 2023-05-19 NOTE — Assessment & Plan Note (Signed)
Chronic and fairly well controlled Has added counseling which is helpful Continue wellbutrin at current dose  

## 2023-05-20 DIAGNOSIS — F321 Major depressive disorder, single episode, moderate: Secondary | ICD-10-CM | POA: Diagnosis not present

## 2023-05-20 LAB — COMPREHENSIVE METABOLIC PANEL
ALT: 10 IU/L (ref 0–32)
AST: 17 IU/L (ref 0–40)
Albumin: 4.2 g/dL (ref 3.8–4.9)
Alkaline Phosphatase: 99 IU/L (ref 44–121)
BUN/Creatinine Ratio: 9 (ref 9–23)
BUN: 9 mg/dL (ref 6–24)
Bilirubin Total: <0.2 mg/dL (ref 0.0–1.2)
CO2: 24 mmol/L (ref 20–29)
Calcium: 8.9 mg/dL (ref 8.7–10.2)
Chloride: 103 mmol/L (ref 96–106)
Creatinine, Ser: 0.99 mg/dL (ref 0.57–1.00)
Globulin, Total: 3 g/dL (ref 1.5–4.5)
Glucose: 97 mg/dL (ref 70–99)
Potassium: 4.6 mmol/L (ref 3.5–5.2)
Sodium: 140 mmol/L (ref 134–144)
Total Protein: 7.2 g/dL (ref 6.0–8.5)
eGFR: 66 mL/min/{1.73_m2} (ref >59)

## 2023-05-20 LAB — LIPID PANEL
Chol/HDL Ratio: 4.9 ratio — ABNORMAL HIGH (ref 0.0–4.4)
Cholesterol, Total: 232 mg/dL — ABNORMAL HIGH (ref 100–199)
HDL: 47 mg/dL
LDL Chol Calc (NIH): 147 mg/dL — ABNORMAL HIGH (ref 0–99)
Triglycerides: 207 mg/dL — ABNORMAL HIGH (ref 0–149)
VLDL Cholesterol Cal: 38 mg/dL (ref 5–40)

## 2023-05-20 LAB — HEMOGLOBIN A1C
Est. average glucose Bld gHb Est-mCnc: 111 mg/dL
Hgb A1c MFr Bld: 5.5 % (ref 4.8–5.6)

## 2023-06-03 DIAGNOSIS — F321 Major depressive disorder, single episode, moderate: Secondary | ICD-10-CM | POA: Diagnosis not present

## 2023-06-15 DIAGNOSIS — M79674 Pain in right toe(s): Secondary | ICD-10-CM | POA: Diagnosis not present

## 2023-06-15 DIAGNOSIS — F330 Major depressive disorder, recurrent, mild: Secondary | ICD-10-CM | POA: Diagnosis not present

## 2023-06-15 DIAGNOSIS — G90529 Complex regional pain syndrome I of unspecified lower limb: Secondary | ICD-10-CM | POA: Diagnosis not present

## 2023-06-15 DIAGNOSIS — I4891 Unspecified atrial fibrillation: Secondary | ICD-10-CM | POA: Diagnosis not present

## 2023-06-15 DIAGNOSIS — M797 Fibromyalgia: Secondary | ICD-10-CM | POA: Diagnosis not present

## 2023-06-15 DIAGNOSIS — M5416 Radiculopathy, lumbar region: Secondary | ICD-10-CM | POA: Diagnosis not present

## 2023-06-15 DIAGNOSIS — Z79891 Long term (current) use of opiate analgesic: Secondary | ICD-10-CM | POA: Diagnosis not present

## 2023-06-15 DIAGNOSIS — M542 Cervicalgia: Secondary | ICD-10-CM | POA: Diagnosis not present

## 2023-06-15 DIAGNOSIS — Z6829 Body mass index (BMI) 29.0-29.9, adult: Secondary | ICD-10-CM | POA: Diagnosis not present

## 2023-06-15 DIAGNOSIS — G894 Chronic pain syndrome: Secondary | ICD-10-CM | POA: Diagnosis not present

## 2023-06-18 DIAGNOSIS — F321 Major depressive disorder, single episode, moderate: Secondary | ICD-10-CM | POA: Diagnosis not present

## 2023-06-22 ENCOUNTER — Ambulatory Visit
Admission: RE | Admit: 2023-06-22 | Discharge: 2023-06-22 | Disposition: A | Discharge status: Home / Self Care | Payer: Medicaid Other | Source: Ambulatory Visit | Attending: Family Medicine | Admitting: Family Medicine

## 2023-06-22 DIAGNOSIS — Z1231 Encounter for screening mammogram for malignant neoplasm of breast: Secondary | ICD-10-CM | POA: Diagnosis not present

## 2023-06-24 ENCOUNTER — Other Ambulatory Visit: Payer: Self-pay | Admitting: Family Medicine

## 2023-06-24 DIAGNOSIS — N6489 Other specified disorders of breast: Secondary | ICD-10-CM

## 2023-06-24 DIAGNOSIS — R928 Other abnormal and inconclusive findings on diagnostic imaging of breast: Secondary | ICD-10-CM

## 2023-06-29 ENCOUNTER — Ambulatory Visit
Admission: RE | Admit: 2023-06-29 | Discharge: 2023-06-29 | Disposition: A | Discharge status: Home / Self Care | Payer: Medicaid Other | Source: Ambulatory Visit | Attending: Family Medicine | Admitting: Family Medicine

## 2023-06-29 DIAGNOSIS — N6489 Other specified disorders of breast: Secondary | ICD-10-CM | POA: Diagnosis not present

## 2023-06-29 DIAGNOSIS — R928 Other abnormal and inconclusive findings on diagnostic imaging of breast: Secondary | ICD-10-CM

## 2023-06-29 DIAGNOSIS — R92322 Mammographic fibroglandular density, left breast: Secondary | ICD-10-CM | POA: Diagnosis not present

## 2023-07-08 DIAGNOSIS — F321 Major depressive disorder, single episode, moderate: Secondary | ICD-10-CM | POA: Diagnosis not present

## 2023-07-20 DIAGNOSIS — M5416 Radiculopathy, lumbar region: Secondary | ICD-10-CM | POA: Diagnosis not present

## 2023-07-20 DIAGNOSIS — M79674 Pain in right toe(s): Secondary | ICD-10-CM | POA: Diagnosis not present

## 2023-07-20 DIAGNOSIS — G894 Chronic pain syndrome: Secondary | ICD-10-CM | POA: Diagnosis not present

## 2023-07-20 DIAGNOSIS — M542 Cervicalgia: Secondary | ICD-10-CM | POA: Diagnosis not present

## 2023-07-20 DIAGNOSIS — Z79891 Long term (current) use of opiate analgesic: Secondary | ICD-10-CM | POA: Diagnosis not present

## 2023-07-20 DIAGNOSIS — F330 Major depressive disorder, recurrent, mild: Secondary | ICD-10-CM | POA: Diagnosis not present

## 2023-07-20 DIAGNOSIS — M797 Fibromyalgia: Secondary | ICD-10-CM | POA: Diagnosis not present

## 2023-07-20 DIAGNOSIS — G90529 Complex regional pain syndrome I of unspecified lower limb: Secondary | ICD-10-CM | POA: Diagnosis not present

## 2023-07-20 DIAGNOSIS — I4891 Unspecified atrial fibrillation: Secondary | ICD-10-CM | POA: Diagnosis not present

## 2023-07-20 DIAGNOSIS — Z6830 Body mass index (BMI) 30.0-30.9, adult: Secondary | ICD-10-CM | POA: Diagnosis not present

## 2023-07-22 DIAGNOSIS — F321 Major depressive disorder, single episode, moderate: Secondary | ICD-10-CM | POA: Diagnosis not present

## 2023-08-05 DIAGNOSIS — F321 Major depressive disorder, single episode, moderate: Secondary | ICD-10-CM | POA: Diagnosis not present

## 2023-08-19 DIAGNOSIS — F321 Major depressive disorder, single episode, moderate: Secondary | ICD-10-CM | POA: Diagnosis not present

## 2023-08-24 DIAGNOSIS — M5416 Radiculopathy, lumbar region: Secondary | ICD-10-CM | POA: Diagnosis not present

## 2023-08-24 DIAGNOSIS — F330 Major depressive disorder, recurrent, mild: Secondary | ICD-10-CM | POA: Diagnosis not present

## 2023-08-24 DIAGNOSIS — M79674 Pain in right toe(s): Secondary | ICD-10-CM | POA: Diagnosis not present

## 2023-08-24 DIAGNOSIS — Z79891 Long term (current) use of opiate analgesic: Secondary | ICD-10-CM | POA: Diagnosis not present

## 2023-08-24 DIAGNOSIS — G894 Chronic pain syndrome: Secondary | ICD-10-CM | POA: Diagnosis not present

## 2023-08-24 DIAGNOSIS — I4891 Unspecified atrial fibrillation: Secondary | ICD-10-CM | POA: Diagnosis not present

## 2023-08-24 DIAGNOSIS — G90529 Complex regional pain syndrome I of unspecified lower limb: Secondary | ICD-10-CM | POA: Diagnosis not present

## 2023-08-24 DIAGNOSIS — M797 Fibromyalgia: Secondary | ICD-10-CM | POA: Diagnosis not present

## 2023-08-24 DIAGNOSIS — M542 Cervicalgia: Secondary | ICD-10-CM | POA: Diagnosis not present

## 2023-08-24 DIAGNOSIS — Z6830 Body mass index (BMI) 30.0-30.9, adult: Secondary | ICD-10-CM | POA: Diagnosis not present

## 2023-08-24 DIAGNOSIS — M25512 Pain in left shoulder: Secondary | ICD-10-CM | POA: Diagnosis not present

## 2023-09-02 DIAGNOSIS — F321 Major depressive disorder, single episode, moderate: Secondary | ICD-10-CM | POA: Diagnosis not present

## 2023-09-16 DIAGNOSIS — F321 Major depressive disorder, single episode, moderate: Secondary | ICD-10-CM | POA: Diagnosis not present

## 2023-09-21 DIAGNOSIS — M797 Fibromyalgia: Secondary | ICD-10-CM | POA: Diagnosis not present

## 2023-09-21 DIAGNOSIS — Z79891 Long term (current) use of opiate analgesic: Secondary | ICD-10-CM | POA: Diagnosis not present

## 2023-09-21 DIAGNOSIS — M79674 Pain in right toe(s): Secondary | ICD-10-CM | POA: Diagnosis not present

## 2023-09-21 DIAGNOSIS — M25512 Pain in left shoulder: Secondary | ICD-10-CM | POA: Diagnosis not present

## 2023-09-21 DIAGNOSIS — F330 Major depressive disorder, recurrent, mild: Secondary | ICD-10-CM | POA: Diagnosis not present

## 2023-09-21 DIAGNOSIS — Z6830 Body mass index (BMI) 30.0-30.9, adult: Secondary | ICD-10-CM | POA: Diagnosis not present

## 2023-09-21 DIAGNOSIS — G894 Chronic pain syndrome: Secondary | ICD-10-CM | POA: Diagnosis not present

## 2023-09-21 DIAGNOSIS — M5416 Radiculopathy, lumbar region: Secondary | ICD-10-CM | POA: Diagnosis not present

## 2023-09-21 DIAGNOSIS — G90529 Complex regional pain syndrome I of unspecified lower limb: Secondary | ICD-10-CM | POA: Diagnosis not present

## 2023-09-21 DIAGNOSIS — I4891 Unspecified atrial fibrillation: Secondary | ICD-10-CM | POA: Diagnosis not present

## 2023-09-21 DIAGNOSIS — M542 Cervicalgia: Secondary | ICD-10-CM | POA: Diagnosis not present

## 2023-10-14 DIAGNOSIS — G90529 Complex regional pain syndrome I of unspecified lower limb: Secondary | ICD-10-CM | POA: Diagnosis not present

## 2023-10-14 DIAGNOSIS — M797 Fibromyalgia: Secondary | ICD-10-CM | POA: Diagnosis not present

## 2023-10-14 DIAGNOSIS — M5416 Radiculopathy, lumbar region: Secondary | ICD-10-CM | POA: Diagnosis not present

## 2023-10-14 DIAGNOSIS — M542 Cervicalgia: Secondary | ICD-10-CM | POA: Diagnosis not present

## 2023-10-14 DIAGNOSIS — M25512 Pain in left shoulder: Secondary | ICD-10-CM | POA: Diagnosis not present

## 2023-10-14 DIAGNOSIS — F330 Major depressive disorder, recurrent, mild: Secondary | ICD-10-CM | POA: Diagnosis not present

## 2023-10-14 DIAGNOSIS — M79674 Pain in right toe(s): Secondary | ICD-10-CM | POA: Diagnosis not present

## 2023-10-14 DIAGNOSIS — Z6828 Body mass index (BMI) 28.0-28.9, adult: Secondary | ICD-10-CM | POA: Diagnosis not present

## 2023-10-14 DIAGNOSIS — Z79891 Long term (current) use of opiate analgesic: Secondary | ICD-10-CM | POA: Diagnosis not present

## 2023-10-14 DIAGNOSIS — I4891 Unspecified atrial fibrillation: Secondary | ICD-10-CM | POA: Diagnosis not present

## 2023-10-14 DIAGNOSIS — G894 Chronic pain syndrome: Secondary | ICD-10-CM | POA: Diagnosis not present

## 2023-10-15 DIAGNOSIS — Z79891 Long term (current) use of opiate analgesic: Secondary | ICD-10-CM | POA: Diagnosis not present

## 2023-11-05 DIAGNOSIS — G894 Chronic pain syndrome: Secondary | ICD-10-CM | POA: Diagnosis not present

## 2023-11-05 DIAGNOSIS — I480 Paroxysmal atrial fibrillation: Secondary | ICD-10-CM | POA: Diagnosis not present

## 2023-11-23 ENCOUNTER — Ambulatory Visit: Payer: Medicaid Other | Admitting: Family Medicine

## 2023-11-26 ENCOUNTER — Telehealth: Payer: Self-pay | Admitting: Family Medicine

## 2023-11-26 NOTE — Telephone Encounter (Signed)
 Followed up with patient's missed appt. pt said she will call back to reschedule when feeling better.

## 2023-12-11 DIAGNOSIS — F321 Major depressive disorder, single episode, moderate: Secondary | ICD-10-CM | POA: Diagnosis not present

## 2023-12-18 DIAGNOSIS — F321 Major depressive disorder, single episode, moderate: Secondary | ICD-10-CM | POA: Diagnosis not present

## 2023-12-21 DIAGNOSIS — M5416 Radiculopathy, lumbar region: Secondary | ICD-10-CM | POA: Diagnosis not present

## 2023-12-21 DIAGNOSIS — I4891 Unspecified atrial fibrillation: Secondary | ICD-10-CM | POA: Diagnosis not present

## 2023-12-21 DIAGNOSIS — Z6829 Body mass index (BMI) 29.0-29.9, adult: Secondary | ICD-10-CM | POA: Diagnosis not present

## 2023-12-21 DIAGNOSIS — G894 Chronic pain syndrome: Secondary | ICD-10-CM | POA: Diagnosis not present

## 2023-12-21 DIAGNOSIS — G90529 Complex regional pain syndrome I of unspecified lower limb: Secondary | ICD-10-CM | POA: Diagnosis not present

## 2023-12-21 DIAGNOSIS — Z79891 Long term (current) use of opiate analgesic: Secondary | ICD-10-CM | POA: Diagnosis not present

## 2023-12-21 DIAGNOSIS — M542 Cervicalgia: Secondary | ICD-10-CM | POA: Diagnosis not present

## 2023-12-21 DIAGNOSIS — M25512 Pain in left shoulder: Secondary | ICD-10-CM | POA: Diagnosis not present

## 2023-12-21 DIAGNOSIS — Z6830 Body mass index (BMI) 30.0-30.9, adult: Secondary | ICD-10-CM | POA: Diagnosis not present

## 2023-12-21 DIAGNOSIS — F330 Major depressive disorder, recurrent, mild: Secondary | ICD-10-CM | POA: Diagnosis not present

## 2023-12-21 DIAGNOSIS — M79674 Pain in right toe(s): Secondary | ICD-10-CM | POA: Diagnosis not present

## 2023-12-21 DIAGNOSIS — M797 Fibromyalgia: Secondary | ICD-10-CM | POA: Diagnosis not present

## 2024-01-01 DIAGNOSIS — F321 Major depressive disorder, single episode, moderate: Secondary | ICD-10-CM | POA: Diagnosis not present

## 2024-01-21 DIAGNOSIS — F321 Major depressive disorder, single episode, moderate: Secondary | ICD-10-CM | POA: Diagnosis not present

## 2024-01-27 ENCOUNTER — Ambulatory Visit (HOSPITAL_COMMUNITY)
Admission: EM | Admit: 2024-01-27 | Discharge: 2024-01-27 | Disposition: A | Discharge status: Home / Self Care | Attending: Emergency Medicine | Admitting: Emergency Medicine

## 2024-01-27 ENCOUNTER — Ambulatory Visit (INDEPENDENT_AMBULATORY_CARE_PROVIDER_SITE_OTHER)

## 2024-01-27 ENCOUNTER — Encounter (HOSPITAL_COMMUNITY): Payer: Self-pay

## 2024-01-27 DIAGNOSIS — S6722XA Crushing injury of left hand, initial encounter: Secondary | ICD-10-CM

## 2024-01-27 MED ORDER — IBUPROFEN 800 MG PO TABS
ORAL_TABLET | ORAL | Status: AC
  Filled 2024-01-27: qty 1

## 2024-01-27 MED ORDER — IBUPROFEN 800 MG PO TABS
800.0000 mg | ORAL_TABLET | Freq: Once | ORAL | Status: AC
  Administered 2024-01-27: 800 mg via ORAL

## 2024-01-27 NOTE — ED Triage Notes (Signed)
 Pt states crushed her lt hand between a 4x4 and her couch on Sunday. States is on pain management and its not working. Bruising and swelling noted.

## 2024-01-27 NOTE — Discharge Instructions (Addendum)
 I do not see any obvious fractures or deformities on your imaging, I will contact you via phone if the radiologist interprets this differently and update you on the treatment plan.  Use the Ace wrap to help with compression and swelling.  Take 800 mg of ibuprofen every 8 hours to help with pain and swelling.  Follow-up with pain management regarding your pain.  If your hand pain persist please follow-up with an orthopedic for further advanced evaluation.

## 2024-01-27 NOTE — ED Provider Notes (Addendum)
 [facility]    CSN: [id number] Arrival date & time: 01/27/24  1537      History   Chief Complaint Chief Complaint  Patient presents with   Hand Injury    HPI Julie Berger is a 58 y.o. female.   Patient presents to clinic over concern of bruising and pain in the dorsum of her left hand.  She was moving and somehow got her hand caught between a couch and a 4 x 4.  She immediately had pain.  She has been moving and pretty busy, came to clinic today because she noticed the pain was worsening.  She is on pain management and takes 10 mg of extended release OxyContin twice daily.  She has been taking 200 mg of ibuprofen daily, took some this morning.  Has been wearing a sleeve on her hand to help with compression and swelling.  The history is provided by the patient and medical records.  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   Other foreign object in esophagus causing other injury, initial encounter     Patient Active Problem List   Diagnosis Date Noted   Mixed hyperlipidemia 10/26/2020   Prediabetes 07/24/2020   Other chest pain    Encounter for long-term opiate analgesic use 01/18/2020   Gastroesophageal reflux disease with esophagitis 11/03/2019   Chronic pain 11/02/2019   History of cervical cancer 11/02/2019   Fibromyalgia 11/02/2019   Obesity 11/02/2019   Not currently working due to disabled status 11/02/2019   MDD (major depressive disorder) 11/02/2019   Complex regional pain syndrome I 11/02/2019   Paroxysmal atrial fibrillation (HCC) 11/02/2019   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) 11/11/2018    Past Surgical History:  Procedure Laterality Date   BACK SURGERY      x 3   CHOLECYSTECTOMY     ESOPHAGOGASTRODUODENOSCOPY (EGD) WITH PROPOFOL N/A 02/02/2020   Procedure: ESOPHAGOGASTRODUODENOSCOPY (EGD) WITH PROPOFOL;  Surgeon: Midge Minium, MD;  Location: [facility];  Service: Endoscopy;  Laterality: N/A;   HIATAL HERNIA REPAIR     HYSTERECTOMY ABDOMINAL WITH SALPINGO-OOPHORECTOMY     KNEE ARTHROSCOPY Left    SPINAL CORD STIMULATOR IMPLANT     TONSILLECTOMY     TRANSORAL INCISIONLESS FUNDOPLICATION     ULNAR NERVE TRANSPOSITION Left     OB History     Gravida  4   Para  4   Term  4   Preterm      AB      Living  4      SAB      IAB      Ectopic      Multiple      Live Births               Home Medications    Prior to Admission medications   Medication Sig Start Date End Date Taking? Authorizing Provider  buPROPion (WELLBUTRIN XL) 300 MG 24 hr tablet TAKE 1 TABLET BY MOUTH ONCE DAILY. 05/19/23   Erasmo Downer, MD  diltiazem (CARDIZEM CD) 120 MG 24 hr capsule Take 120 mg by mouth daily. 02/01/19 05/19/23  [provider]  HM LIDOCAINE  PATCH EX Apply 1 patch topically daily.    [provider]  naloxone [facility]) 4 MG/0.1ML LIQD nasal spray kit USE UTD FOR SUSPECTED OVERDOSE. 12/21/17   [provider]  oxyCODONE ER (XTAMPZA ER) 9 MG C12A Take by mouth 2 (two) times daily. 09/21/20   [provider]  pregabalin (LYRICA) 150 MG capsule Take 1 capsule (150 mg total) by mouth 3 (three) times daily. 01/26/20   Erasmo Downer, MD    Family History Family History  Problem Relation Age of Onset   Heart attack Father    Mitral valve prolapse Father    Hypertension Father    Dementia Mother    Lupus Mother    Fibromyalgia Mother    Diabetes Brother    Cancer Brother        unknown type   Bone cancer Brother    Colon cancer Neg Hx    Breast cancer Neg Hx     Social History Social History   Tobacco Use   Smoking status: Former    Current packs/day: 0.00    Average  packs/day: 1 pack/day for 20.0 years (20.0 ttl pk-yrs)    Types: Cigarettes    Start date: 08/22/1979    Quit date: 08/22/1999    Years since quitting: 24.4   Smokeless tobacco: Never  Vaping Use   Vaping status: Never Used  Substance Use Topics   Alcohol use: Never   Drug use: Never     Allergies   Nsaids   Review of Systems Review of Systems  Per HPI   Physical Exam Triage Vital Signs ED Triage Vitals  Encounter Vitals Group     BP 01/27/24 1648 (!) 150/91     Systolic BP Percentile --      Diastolic BP Percentile --      Pulse Rate 01/27/24 1648 86     Resp 01/27/24 1648 18     Temp 01/27/24 1648 98.1 F (36.7 C)     Temp Source 01/27/24 1648 Oral     SpO2 01/27/24 1648 95 %     Weight --      Height --      Head Circumference --      Peak Flow --      Pain Score 01/27/24 1649 9     Pain Loc --      Pain Education --      Exclude from Growth Chart --    No data found.  Updated Vital Signs BP (!) 150/91 (BP Location: Left Arm)   Pulse 86   Temp 98.1 F (36.7 C) (Oral)   Resp 18   SpO2 95%   Visual Acuity Right Eye Distance:   Left Eye Distance:   Bilateral Distance:    Right Eye Near:   Left Eye Near:    Bilateral Near:     Physical Exam Vitals and nursing note reviewed.  Constitutional:      Appearance: Normal appearance.  HENT:     Head: Normocephalic and atraumatic.     Nose: Nose normal.     Mouth/Throat:     Mouth: Mucous membranes are moist.  Eyes:     Conjunctiva/sclera: Conjunctivae normal.  Cardiovascular:     Rate and Rhythm: Normal rate.     Pulses: Normal pulses.  Pulmonary:     Effort: Pulmonary effort is normal. No respiratory distress.  Musculoskeletal:        General: Tenderness and signs of injury present. No  swelling or deformity.     Left hand: Tenderness and bony tenderness present. No swelling or deformity. Decreased range of motion. Normal capillary refill. Normal pulse.     Comments: Left hand with bruising  through the dorsum of the metacarpals.  Diffuse hand pain with palpation and range of motion.  Radial pulses 2+.  Brisk capillary refill.  Able to move all fingers freely.  Skin:    General: Skin is warm and dry.     Capillary Refill: Capillary refill takes less than 2 seconds.  Neurological:     General: No focal deficit present.     Mental Status: She is alert.  Psychiatric:        Mood and Affect: Mood normal.        Behavior: Behavior is cooperative.      UC Treatments / Results  Labs (all labs ordered are listed, but only abnormal results are displayed) Labs Reviewed - No data to display  EKG   Radiology DG Hand Complete Left Result Date: 01/27/2024 CLINICAL DATA:  Crushed hand between a 4 x 4 and couch 3 days prior. Bruising and swelling. EXAM: LEFT HAND - COMPLETE 3+ VIEW COMPARISON:  None Available. FINDINGS: Mildly decreased bone mineralization. Mild second through fifth DIP greater than PIP joint space narrowing and peripheral spurring. There is a 2 mm ulnar positive variance. No acute fracture or dislocation. No cortical erosion. 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 No acute fracture is seen. Electronically Signed   By: Neita Garnet M.D.   On: 01/27/2024 20:04    Procedures Procedures (including critical care time)  Medications Ordered in UC Medications  ibuprofen (ADVIL) tablet 800 mg (800 mg Oral Given 01/27/24 1735)    Initial Impression / Assessment and Plan / UC Course  I have reviewed the triage vital signs and the nursing notes.  Pertinent labs & imaging results that were available during my care of the patient were reviewed by me and considered in my medical decision making (see chart for details).  Vitals and triage reviewed, patient is hemodynamically stable.  Bruising to the dorsum of the left hand along 2nd through 5th metacarpals.  Imaging independently reviewed by me, no obvious fractures or deformities seen.   Diffuse hand pain on physical exam with no obvious deformity.  Will provide Ace wrap, ibuprofen and encourage pain management and orthopedic follow-up if symptoms persist.  Plan of care, follow-up care return precautions given, no questions at this time.  8:00 pm - Radiology overread shows some mild osteoarthritis, no acute fractures or dislocations.  No changes or updates to treatment plan at this time.     Final Clinical Impressions(s) / UC Diagnoses   Final diagnoses:  Crush injury to hand, left, initial encounter     Discharge Instructions      I do not see any obvious fractures or deformities on your imaging, I will contact you via phone if the radiologist interprets this differently and update you on the treatment plan.  Use the Ace wrap to help with compression and swelling.  Take 800 mg of ibuprofen every 8 hours to help with pain and swelling.  Follow-up with pain management regarding your pain.  If your hand pain persist please follow-up with an orthopedic for further advanced evaluation.     ED Prescriptions   None    I have reviewed the PDMP during this encounter.   Cagney Steenson, [place], [place] 01/27/24 1730    Rinaldo Ratel,  [place], [place] 01/27/24 2009

## 2024-02-10 DIAGNOSIS — F321 Major depressive disorder, single episode, moderate: Secondary | ICD-10-CM | POA: Diagnosis not present

## 2024-02-17 DIAGNOSIS — M79674 Pain in right toe(s): Secondary | ICD-10-CM | POA: Diagnosis not present

## 2024-02-17 DIAGNOSIS — M25512 Pain in left shoulder: Secondary | ICD-10-CM | POA: Diagnosis not present

## 2024-02-17 DIAGNOSIS — I4891 Unspecified atrial fibrillation: Secondary | ICD-10-CM | POA: Diagnosis not present

## 2024-02-17 DIAGNOSIS — Z6829 Body mass index (BMI) 29.0-29.9, adult: Secondary | ICD-10-CM | POA: Diagnosis not present

## 2024-02-17 DIAGNOSIS — M797 Fibromyalgia: Secondary | ICD-10-CM | POA: Diagnosis not present

## 2024-02-17 DIAGNOSIS — F330 Major depressive disorder, recurrent, mild: Secondary | ICD-10-CM | POA: Diagnosis not present

## 2024-02-17 DIAGNOSIS — Z79891 Long term (current) use of opiate analgesic: Secondary | ICD-10-CM | POA: Diagnosis not present

## 2024-02-17 DIAGNOSIS — G90529 Complex regional pain syndrome I of unspecified lower limb: Secondary | ICD-10-CM | POA: Diagnosis not present

## 2024-02-17 DIAGNOSIS — G894 Chronic pain syndrome: Secondary | ICD-10-CM | POA: Diagnosis not present

## 2024-02-17 DIAGNOSIS — M542 Cervicalgia: Secondary | ICD-10-CM | POA: Diagnosis not present

## 2024-02-17 DIAGNOSIS — M5416 Radiculopathy, lumbar region: Secondary | ICD-10-CM | POA: Diagnosis not present

## 2024-04-04 ENCOUNTER — Encounter: Payer: Self-pay | Admitting: Family Medicine

## 2024-04-04 ENCOUNTER — Ambulatory Visit: Admitting: Family Medicine

## 2024-04-04 VITALS — BP 126/96 | HR 88 | Ht 64.0 in | Wt 179.7 lb

## 2024-04-04 DIAGNOSIS — T7421XA Adult sexual abuse, confirmed, initial encounter: Secondary | ICD-10-CM

## 2024-04-04 DIAGNOSIS — M797 Fibromyalgia: Secondary | ICD-10-CM | POA: Diagnosis not present

## 2024-04-04 DIAGNOSIS — G90523 Complex regional pain syndrome I of lower limb, bilateral: Secondary | ICD-10-CM

## 2024-04-04 DIAGNOSIS — F3341 Major depressive disorder, recurrent, in partial remission: Secondary | ICD-10-CM | POA: Diagnosis not present

## 2024-04-04 MED ORDER — DILTIAZEM HCL ER COATED BEADS 120 MG PO CP24: 120.0000 mg | ORAL_CAPSULE | Freq: Every day | ORAL | 1 refills | Status: DC

## 2024-04-04 MED ORDER — BUPROPION HCL ER (XL) 300 MG PO TB24
ORAL_TABLET | ORAL | 3 refills | Status: DC
Start: 2024-04-04 — End: 2024-08-02

## 2024-04-04 NOTE — Progress Notes (Deleted)
   Established Patient Office Visit  Subjective   Patient ID: Julie Berger, female    DOB: October 02, 1966  Age: 58 y.o. MRN: [id number]  Chief Complaint  Patient presents with   Durable Medical Equipment    Pt is present and would like to discuss a ramp and hand grips for bathroom. She reports    HPI    ROS    Objective:     There were no vitals taken for this visit.   Physical Exam   No results found for any visits on 04/04/24.    The 10-year ASCVD risk score (Arnett DK, et al., 2019) is: 5.1%    Assessment & Plan:   Problem List Items Addressed This Visit   None   No follow-ups on file.    Monda Angry, Medical Student
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
## 2024-04-12 DIAGNOSIS — Z79891 Long term (current) use of opiate analgesic: Secondary | ICD-10-CM | POA: Diagnosis not present

## 2024-04-13 DIAGNOSIS — M5416 Radiculopathy, lumbar region: Secondary | ICD-10-CM | POA: Diagnosis not present

## 2024-04-13 DIAGNOSIS — M797 Fibromyalgia: Secondary | ICD-10-CM | POA: Diagnosis not present

## 2024-04-13 DIAGNOSIS — Z79891 Long term (current) use of opiate analgesic: Secondary | ICD-10-CM | POA: Diagnosis not present

## 2024-04-13 DIAGNOSIS — M25512 Pain in left shoulder: Secondary | ICD-10-CM | POA: Diagnosis not present

## 2024-04-13 DIAGNOSIS — M542 Cervicalgia: Secondary | ICD-10-CM | POA: Diagnosis not present

## 2024-04-13 DIAGNOSIS — F330 Major depressive disorder, recurrent, mild: Secondary | ICD-10-CM | POA: Diagnosis not present

## 2024-04-13 DIAGNOSIS — I4891 Unspecified atrial fibrillation: Secondary | ICD-10-CM | POA: Diagnosis not present

## 2024-04-13 DIAGNOSIS — G894 Chronic pain syndrome: Secondary | ICD-10-CM | POA: Diagnosis not present

## 2024-04-13 DIAGNOSIS — G90529 Complex regional pain syndrome I of unspecified lower limb: Secondary | ICD-10-CM | POA: Diagnosis not present

## 2024-04-13 DIAGNOSIS — M79674 Pain in right toe(s): Secondary | ICD-10-CM | POA: Diagnosis not present

## 2024-04-21 DIAGNOSIS — F321 Major depressive disorder, single episode, moderate: Secondary | ICD-10-CM | POA: Diagnosis not present

## 2024-05-05 DIAGNOSIS — F321 Major depressive disorder, single episode, moderate: Secondary | ICD-10-CM | POA: Diagnosis not present

## 2024-05-10 ENCOUNTER — Encounter: Admitting: Family Medicine

## 2024-05-16 DIAGNOSIS — G90529 Complex regional pain syndrome I of unspecified lower limb: Secondary | ICD-10-CM | POA: Diagnosis not present

## 2024-05-16 DIAGNOSIS — M79674 Pain in right toe(s): Secondary | ICD-10-CM | POA: Diagnosis not present

## 2024-05-16 DIAGNOSIS — M25512 Pain in left shoulder: Secondary | ICD-10-CM | POA: Diagnosis not present

## 2024-05-16 DIAGNOSIS — F330 Major depressive disorder, recurrent, mild: Secondary | ICD-10-CM | POA: Diagnosis not present

## 2024-05-16 DIAGNOSIS — M797 Fibromyalgia: Secondary | ICD-10-CM | POA: Diagnosis not present

## 2024-05-16 DIAGNOSIS — M5416 Radiculopathy, lumbar region: Secondary | ICD-10-CM | POA: Diagnosis not present

## 2024-05-16 DIAGNOSIS — R110 Nausea: Secondary | ICD-10-CM | POA: Diagnosis not present

## 2024-05-16 DIAGNOSIS — M542 Cervicalgia: Secondary | ICD-10-CM | POA: Diagnosis not present

## 2024-05-16 DIAGNOSIS — Z79891 Long term (current) use of opiate analgesic: Secondary | ICD-10-CM | POA: Diagnosis not present

## 2024-05-16 DIAGNOSIS — I4891 Unspecified atrial fibrillation: Secondary | ICD-10-CM | POA: Diagnosis not present

## 2024-05-16 DIAGNOSIS — G894 Chronic pain syndrome: Secondary | ICD-10-CM | POA: Diagnosis not present

## 2024-05-25 DIAGNOSIS — I480 Paroxysmal atrial fibrillation: Secondary | ICD-10-CM | POA: Diagnosis not present

## 2024-05-25 DIAGNOSIS — K209 Esophagitis, unspecified without bleeding: Secondary | ICD-10-CM | POA: Diagnosis not present

## 2024-05-25 DIAGNOSIS — K2270 Barrett's esophagus without dysplasia: Secondary | ICD-10-CM | POA: Diagnosis not present

## 2024-05-25 DIAGNOSIS — G894 Chronic pain syndrome: Secondary | ICD-10-CM | POA: Diagnosis not present

## 2024-06-01 DIAGNOSIS — F321 Major depressive disorder, single episode, moderate: Secondary | ICD-10-CM | POA: Diagnosis not present

## 2024-06-22 DIAGNOSIS — G90529 Complex regional pain syndrome I of unspecified lower limb: Secondary | ICD-10-CM | POA: Diagnosis not present

## 2024-06-22 DIAGNOSIS — M542 Cervicalgia: Secondary | ICD-10-CM | POA: Diagnosis not present

## 2024-06-22 DIAGNOSIS — R110 Nausea: Secondary | ICD-10-CM | POA: Diagnosis not present

## 2024-06-22 DIAGNOSIS — M79674 Pain in right toe(s): Secondary | ICD-10-CM | POA: Diagnosis not present

## 2024-06-22 DIAGNOSIS — M797 Fibromyalgia: Secondary | ICD-10-CM | POA: Diagnosis not present

## 2024-06-22 DIAGNOSIS — I4891 Unspecified atrial fibrillation: Secondary | ICD-10-CM | POA: Diagnosis not present

## 2024-06-22 DIAGNOSIS — M25512 Pain in left shoulder: Secondary | ICD-10-CM | POA: Diagnosis not present

## 2024-06-22 DIAGNOSIS — Z79891 Long term (current) use of opiate analgesic: Secondary | ICD-10-CM | POA: Diagnosis not present

## 2024-06-22 DIAGNOSIS — G894 Chronic pain syndrome: Secondary | ICD-10-CM | POA: Diagnosis not present

## 2024-06-22 DIAGNOSIS — F330 Major depressive disorder, recurrent, mild: Secondary | ICD-10-CM | POA: Diagnosis not present

## 2024-06-22 DIAGNOSIS — M5416 Radiculopathy, lumbar region: Secondary | ICD-10-CM | POA: Diagnosis not present

## 2024-08-01 DIAGNOSIS — M25512 Pain in left shoulder: Secondary | ICD-10-CM | POA: Diagnosis not present

## 2024-08-01 DIAGNOSIS — M5416 Radiculopathy, lumbar region: Secondary | ICD-10-CM | POA: Diagnosis not present

## 2024-08-01 DIAGNOSIS — G894 Chronic pain syndrome: Secondary | ICD-10-CM | POA: Diagnosis not present

## 2024-08-01 DIAGNOSIS — M542 Cervicalgia: Secondary | ICD-10-CM | POA: Diagnosis not present

## 2024-08-01 DIAGNOSIS — G90529 Complex regional pain syndrome I of unspecified lower limb: Secondary | ICD-10-CM | POA: Diagnosis not present

## 2024-08-01 DIAGNOSIS — I4891 Unspecified atrial fibrillation: Secondary | ICD-10-CM | POA: Diagnosis not present

## 2024-08-01 DIAGNOSIS — M797 Fibromyalgia: Secondary | ICD-10-CM | POA: Diagnosis not present

## 2024-08-01 DIAGNOSIS — M79674 Pain in right toe(s): Secondary | ICD-10-CM | POA: Diagnosis not present

## 2024-08-01 DIAGNOSIS — R110 Nausea: Secondary | ICD-10-CM | POA: Diagnosis not present

## 2024-08-01 DIAGNOSIS — Z79891 Long term (current) use of opiate analgesic: Secondary | ICD-10-CM | POA: Diagnosis not present

## 2024-08-01 DIAGNOSIS — F330 Major depressive disorder, recurrent, mild: Secondary | ICD-10-CM | POA: Diagnosis not present

## 2024-08-02 ENCOUNTER — Ambulatory Visit (INDEPENDENT_AMBULATORY_CARE_PROVIDER_SITE_OTHER): Admitting: Family Medicine

## 2024-08-02 ENCOUNTER — Encounter: Payer: Self-pay | Admitting: Family Medicine

## 2024-08-02 VITALS — BP 114/88 | HR 89 | Ht 64.0 in | Wt 194.0 lb

## 2024-08-02 DIAGNOSIS — Z1211 Encounter for screening for malignant neoplasm of colon: Secondary | ICD-10-CM

## 2024-08-02 DIAGNOSIS — J40 Bronchitis, not specified as acute or chronic: Secondary | ICD-10-CM

## 2024-08-02 DIAGNOSIS — E782 Mixed hyperlipidemia: Secondary | ICD-10-CM

## 2024-08-02 DIAGNOSIS — Z0000 Encounter for general adult medical examination without abnormal findings: Secondary | ICD-10-CM | POA: Diagnosis not present

## 2024-08-02 DIAGNOSIS — F331 Major depressive disorder, recurrent, moderate: Secondary | ICD-10-CM

## 2024-08-02 DIAGNOSIS — R7303 Prediabetes: Secondary | ICD-10-CM | POA: Diagnosis not present

## 2024-08-02 DIAGNOSIS — I480 Paroxysmal atrial fibrillation: Secondary | ICD-10-CM

## 2024-08-02 DIAGNOSIS — Z1231 Encounter for screening mammogram for malignant neoplasm of breast: Secondary | ICD-10-CM | POA: Diagnosis not present

## 2024-08-02 DIAGNOSIS — M797 Fibromyalgia: Secondary | ICD-10-CM

## 2024-08-02 MED ORDER — PREDNISONE 20 MG PO TABS: 40.0000 mg | ORAL_TABLET | Freq: Every day | ORAL | 0 refills | Status: AC

## 2024-08-02 MED ORDER — COVID-19 MRNA VAC-TRIS(PFIZER) 30 MCG/0.3ML IM SUSY: 0.3000 mL | PREFILLED_SYRINGE | Freq: Once | INTRAMUSCULAR | 0 refills | Status: AC

## 2024-08-02 MED ORDER — DILTIAZEM HCL ER COATED BEADS 120 MG PO CP24: 120.0000 mg | ORAL_CAPSULE | Freq: Every day | ORAL | 1 refills | Status: DC

## 2024-08-02 MED ORDER — BUPROPION HCL ER (XL) 450 MG PO TB24: 450.0000 mg | ORAL_TABLET | Freq: Every day | ORAL | 1 refills | Status: AC

## 2024-08-02 NOTE — Patient Instructions (Signed)
 Consider asking at the pharmacy about the Prevnar (pneumonia), flu, COVID, and RSV

## 2024-08-02 NOTE — Assessment & Plan Note (Signed)
 Chronic pain management challenges. Current medications include oxycodone  and Lyrica . Previous effective medications Marinol and Xtampza  are no longer available. Potential need for surgery to address battery pack for pain control device. - Monitor for potential surgical intervention for pain control device

## 2024-08-02 NOTE — Progress Notes (Signed)
 Complete physical exam   Patient: Julie Berger   DOB: 1966/05/06   58 y.o. Female  MRN: [id number] Visit Date: 08/02/2024  Today's healthcare provider: Jon Eva, MD   Chief Complaint  Patient presents with   Annual Exam    Last completed 05/19/23 Diet - well balanced Exercise - none Feeling - more poor than fair and reports feeling poorly today Sleeping - poorly Concerns -  Concerns with lungs due to heavy tight chest associated with random cough and wheezing especially at night X 1 month. Would like new referral to different cardiologist.    Cologuard - ok to order Mammogram - ok to order   Immunizations    Flu and Pneumococcal ok to order as long as the provider feels she is well enough to receive   Subjective    Julie Berger is a 58 y.o. female who presents today for a complete physical exam.   Discussed the use of AI scribe software for clinical note transcription with the patient, who gave verbal consent to proceed.  History of Present Illness   Julie Berger is a 58 year old female with atrial fibrillation and chronic pain who presents with cough, shortness of breath, and wheezing. She is accompanied by her daughter.  She has experienced a cough, shortness of breath, and wheezing for one to two months. Initially, nighttime wheezing progressed to daytime symptoms. Over-the-counter cold medicine provided minimal relief. She quit smoking 25 years ago, and there is no smoking in her household. No previous similar episodes have lasted this long.  She has atrial fibrillation and takes diltiazem  120 mg. She is dissatisfied with her current cardiologist, feeling her concerns about medication needs and leg swelling were dismissed. Her leg swelling is part of a flare-up she is experiencing.  She experiences chronic pain and takes oxycodone  and Lyrica . Discontinuation of Marinol and Xtampza  has led to regression in pain management. She takes Wellbutrin  300 mg for  depression related to chronic pain and stress, with concerns about potential tolerance. She is experiencing a flare-up of her chronic pain condition and issues with a battery pack for a stimulator, which may require surgical intervention.        Last depression screening scores    08/02/2024    1:57 PM 04/04/2024    8:16 AM 05/19/2023    3:38 PM 05/02/2022   11:27 AM 09/06/2021   10:36 AM  Depression screen PHQ 2/9  Decreased Interest 1 1 1  0 1  Down, Depressed, Hopeless 1 1 1 1 1   PHQ - 2 Score 2 2 2 1 2   Altered sleeping 2 2 1 1 1   Tired, decreased energy 2 2 1 1 1   Change in appetite 3 1 0 0 1  Feeling bad or failure about yourself  1 1 1  0 1  Trouble concentrating 1 2 0 0 1  Moving slowly or fidgety/restless 1 0 0 0 0  Suicidal thoughts 1 0 0 0 0  PHQ-9 Score 13 10 5 3 7   Difficult doing work/chores Somewhat difficult Somewhat difficult Somewhat difficult Not difficult at all Somewhat difficult       08/02/2024    1:57 PM 04/04/2024    8:17 AM  GAD 7 : Generalized Anxiety Score  Nervous, Anxious, on Edge 2 1  Control/stop worrying 1 1  Worry too much - different things 2 1  Trouble relaxing 1 2  Restless 0 0  Easily annoyed or irritable 2 1  Afraid -  awful might happen 0 1  Total GAD 7 Score 8 7  Anxiety Difficulty Somewhat difficult       Last fall risk screening    05/19/2023    3:37 PM  Fall Risk   Falls in the past year? 0  Number falls in past yr: 0  Injury with Fall? 0  Risk for fall due to : No Fall Risks  Follow up Falls evaluation completed        Medications: Outpatient Medications Prior to Visit  Medication Sig   HM LIDOCAINE  PATCH EX Apply 1 patch topically daily.   naloxone (NARCAN) 4 MG/0.1ML LIQD nasal spray kit USE UTD FOR SUSPECTED OVERDOSE.   OXYCONTIN  10 MG 12 hr tablet Take 10 mg by mouth every 12 (twelve) hours.   pregabalin  (LYRICA ) 150 MG capsule Take 1 capsule (150 mg total) by mouth 3 (three) times daily.   [DISCONTINUED]  buPROPion  (WELLBUTRIN  XL) 300 MG 24 hr tablet TAKE 1 TABLET BY MOUTH ONCE DAILY.   [DISCONTINUED] diltiazem  (CARDIZEM  CD) 120 MG 24 hr capsule Take 1 capsule (120 mg total) by mouth daily.   No facility-administered medications prior to visit.    Review of Systems    Objective    BP 114/88 (BP Location: Left Arm, Patient Position: Sitting, Cuff Size: Normal)   Pulse 89   Ht 5' 4 (1.626 m)   Wt 194 lb (88 kg)   SpO2 98%   BMI 33.30 kg/m    Physical Exam Vitals reviewed.  Constitutional:      General: She is not in acute distress.    Appearance: Normal appearance. She is well-developed. She is not diaphoretic.  HENT:     Head: Normocephalic and atraumatic.     Right Ear: Tympanic membrane, ear canal and external ear normal.     Left Ear: Tympanic membrane, ear canal and external ear normal.     Nose: Nose normal.     Mouth/Throat:     Mouth: Mucous membranes are moist.     Pharynx: Oropharynx is clear. No oropharyngeal exudate.  Eyes:     General: No scleral icterus.    Conjunctiva/sclera: Conjunctivae normal.     Pupils: Pupils are equal, round, and reactive to light.  Neck:     Thyroid: No thyromegaly.  Cardiovascular:     Rate and Rhythm: Normal rate and regular rhythm.     Heart sounds: Normal heart sounds. No murmur heard. Pulmonary:     Effort: Pulmonary effort is normal. No respiratory distress.     Breath sounds: Wheezing present. No rales.  Abdominal:     General: There is no distension.     Palpations: Abdomen is soft.     Tenderness: There is no abdominal tenderness.  Musculoskeletal:        General: No deformity.     Cervical back: Neck supple.     Right lower leg: No edema.     Left lower leg: No edema.  Lymphadenopathy:     Cervical: No cervical adenopathy.  Skin:    General: Skin is warm and dry.     Findings: No rash.  Neurological:     Mental Status: She is alert and oriented to person, place, and time. Mental status is at baseline.      Gait: Gait normal.  Psychiatric:        Mood and Affect: Mood normal.        Behavior: Behavior normal.  Thought Content: Thought content normal.      No results found for any visits on 08/02/24.  Assessment & Plan    Routine Health Maintenance and Physical Exam  Exercise Activities and Dietary recommendations  Goals   None     Immunization History  Administered Date(s) Administered   Influenza Inj Mdck Quad Pf 11/11/2019   Influenza,inj,Quad PF,6+ Mos 11/11/2019, 07/24/2020, 09/06/2021   Moderna Sars-Covid-2 Vaccination 03/17/2020, 04/14/2020   Tdap 02/17/2020   Zoster Recombinant(Shingrix ) 02/17/2020, 05/02/2022    Health Maintenance  Topic Date Due   Pneumococcal Vaccine: 50+ Years (1 of 1 - PCV) Never done   Fecal DNA (Cologuard)  12/28/2022   Influenza Vaccine  06/24/2024   COVID-19 Vaccine (3 - 2025-26 season) 07/25/2024   MAMMOGRAM  06/21/2025   DTaP/Tdap/Td (2 - Td or Tdap) 02/16/2030   Hepatitis C Screening  Completed   HIV Screening  Completed   Zoster Vaccines- Shingrix   Completed   Hepatitis B Vaccines 19-59 Average Risk  Aged Out   HPV VACCINES  Aged Out   Meningococcal B Vaccine  Aged Out    Discussed health benefits of physical activity, and encouraged her to engage in regular exercise appropriate for her age and condition.  Problem List Items Addressed This Visit       Cardiovascular and Mediastinum   Paroxysmal atrial fibrillation (HCC)   Paroxysmal atrial fibrillation managed with diltiazem  120 mg. Dissatisfaction with current cardiologist. - [facility] - Refill diltiazem  prescription      Relevant Medications   diltiazem  (CARDIZEM  CD) 120 MG 24 hr capsule   Other Relevant Orders   Ambulatory referral to Cardiology     Other   Fibromyalgia   Chronic pain management challenges. Current medications include oxycodone  and Lyrica . Previous effective medications Marinol and Xtampza  are no longer available.  Potential need for surgery to address battery pack for pain control device. - Monitor for potential surgical intervention for pain control device      Relevant Medications   buPROPion  450 MG TB24   predniSONE  (DELTASONE ) 20 MG tablet   MDD (major depressive disorder)   Depression managed with Wellbutrin  300 mg. Concerns about potential tolerance to current dose. Decision to increase Wellbutrin  to 450 mg due to ongoing pain and stress. - Increase Wellbutrin  to 450 mg - Refill Wellbutrin  prescription - Schedule follow-up in 3 months to assess response to medication adjustment      Relevant Medications   buPROPion  450 MG TB24   Prediabetes   Routine monitoring of blood glucose levels as part of health maintenance. - Order labs to check A1c levels       Relevant Orders   Hemoglobin A1c   Mixed hyperlipidemia   Routine monitoring of cholesterol levels as part of health maintenance. - Order labs to check cholesterol levels      Relevant Medications   diltiazem  (CARDIZEM  CD) 120 MG 24 hr capsule   Other Relevant Orders   Comprehensive metabolic panel with GFR   Lipid panel   Other Visit Diagnoses       Encounter for annual physical exam    -  Primary   Relevant Orders   Hemoglobin A1c   Comprehensive metabolic panel with GFR   Lipid panel     Breast cancer screening by mammogram       Relevant Orders   MM 3D SCREENING MAMMOGRAM BILATERAL BREAST     Colon cancer screening       Relevant  Orders   Cologuard     Bronchitis               Adult Wellness Visit Routine wellness visit to discuss general health maintenance and screenings. - Discuss flu shot, pneumonia shot, and RSV vaccine after assessing lung condition - Order screening mammogram - Order Cologuard for colon cancer screening - Order labs for cholesterol, kidney and liver function, and A1c  Bronchitis Chronic cough with wheezing and shortness of breath for 1-2 months. Remote smoking history. Wheezing on  lung examination suggests bronchitis with lung inflammation. - Prescribe prednisone  40 mg daily for 7 days - Advise taking prednisone  in the morning with food - Advise against taking NSAIDs with prednisone  - Recommend honey for cough relief - Delay vaccinations until 2-4 weeks after completing prednisone  course       Return in about 3 months (around 11/01/2024) for MDD/GAD f/u, virtual ok.     Jon Eva, MD  [facility] [phone] (phone) [phone] (fax)  [facility]

## 2024-08-02 NOTE — Assessment & Plan Note (Signed)
 Paroxysmal atrial fibrillation managed with diltiazem  120 mg. Dissatisfaction with current cardiologist. - Refer to [facility] - Refill diltiazem  prescription

## 2024-08-02 NOTE — Assessment & Plan Note (Signed)
 Routine monitoring of blood glucose levels as part of health maintenance. - Order labs to check A1c levels

## 2024-08-02 NOTE — Assessment & Plan Note (Signed)
 Routine monitoring of cholesterol levels as part of health maintenance. - Order labs to check cholesterol levels

## 2024-08-02 NOTE — Assessment & Plan Note (Signed)
 Depression managed with Wellbutrin  300 mg. Concerns about potential tolerance to current dose. Decision to increase Wellbutrin  to 450 mg due to ongoing pain and stress. - Increase Wellbutrin  to 450 mg - Refill Wellbutrin  prescription - Schedule follow-up in 3 months to assess response to medication adjustment

## 2024-08-03 LAB — LIPID PANEL
Chol/HDL Ratio: 5.4 ratio — ABNORMAL HIGH (ref 0.0–4.4)
Cholesterol, Total: 228 mg/dL — ABNORMAL HIGH (ref 100–199)
HDL: 42 mg/dL (ref >39)
LDL Chol Calc (NIH): 125 mg/dL — ABNORMAL HIGH (ref 0–99)
Triglycerides: 345 mg/dL — ABNORMAL HIGH (ref 0–149)
VLDL Cholesterol Cal: 61 mg/dL — ABNORMAL HIGH (ref 5–40)

## 2024-08-03 LAB — COMPREHENSIVE METABOLIC PANEL WITH GFR
ALT: 15 IU/L (ref 0–32)
AST: 15 IU/L (ref 0–40)
Albumin: 4.3 g/dL (ref 3.8–4.9)
Alkaline Phosphatase: 97 IU/L (ref 44–121)
BUN/Creatinine Ratio: 15 (ref 12–28)
BUN: 14 mg/dL (ref 8–27)
Bilirubin Total: 0.3 mg/dL (ref 0.0–1.2)
CO2: 23 mmol/L (ref 20–29)
Calcium: 9.4 mg/dL (ref 8.7–10.3)
Chloride: 102 mmol/L (ref 96–106)
Creatinine, Ser: 0.93 mg/dL (ref 0.57–1.00)
Globulin, Total: 2.9 g/dL (ref 1.5–4.5)
Glucose: 84 mg/dL (ref 70–99)
Potassium: 4.7 mmol/L (ref 3.5–5.2)
Sodium: 138 mmol/L (ref 134–144)
Total Protein: 7.2 g/dL (ref 6.0–8.5)
eGFR: 70 mL/min/1.73 (ref >59)

## 2024-08-03 LAB — HEMOGLOBIN A1C
Est. average glucose Bld gHb Est-mCnc: 114 mg/dL
Hgb A1c MFr Bld: 5.6 % (ref 4.8–5.6)

## 2024-08-04 ENCOUNTER — Ambulatory Visit: Payer: Self-pay | Admitting: Family Medicine

## 2024-08-04 ENCOUNTER — Other Ambulatory Visit (HOSPITAL_COMMUNITY): Payer: Self-pay

## 2024-08-04 ENCOUNTER — Telehealth: Payer: Self-pay

## 2024-08-04 DIAGNOSIS — R195 Other fecal abnormalities: Secondary | ICD-10-CM

## 2024-08-05 ENCOUNTER — Other Ambulatory Visit (HOSPITAL_COMMUNITY): Payer: Self-pay

## 2024-08-05 NOTE — Telephone Encounter (Signed)
 Pharmacy Patient Advocate Encounter  Received notification from [employer] that Prior Authorization for buPROPion  HCl ER (XL) 450MG  er tablets  has been APPROVED from 08/04/24 to 08/04/25. Unable to obtain price due to refill too soon rejection, last fill date 08/04/24 next available fill date11/18/25   PA #/Case ID/Reference #: [id number]

## 2024-08-11 ENCOUNTER — Ambulatory Visit: Payer: Self-pay

## 2024-08-11 ENCOUNTER — Other Ambulatory Visit: Payer: Self-pay

## 2024-08-11 ENCOUNTER — Emergency Department (HOSPITAL_COMMUNITY)
Admission: EM | Admit: 2024-08-11 | Discharge: 2024-08-11 | Disposition: A | Discharge status: Home / Self Care | Attending: Emergency Medicine | Admitting: Emergency Medicine

## 2024-08-11 ENCOUNTER — Emergency Department (HOSPITAL_COMMUNITY)

## 2024-08-11 DIAGNOSIS — R531 Weakness: Secondary | ICD-10-CM | POA: Diagnosis not present

## 2024-08-11 DIAGNOSIS — J988 Other specified respiratory disorders: Secondary | ICD-10-CM

## 2024-08-11 DIAGNOSIS — R062 Wheezing: Secondary | ICD-10-CM | POA: Insufficient documentation

## 2024-08-11 DIAGNOSIS — R0602 Shortness of breath: Secondary | ICD-10-CM | POA: Diagnosis not present

## 2024-08-11 DIAGNOSIS — R0789 Other chest pain: Secondary | ICD-10-CM | POA: Diagnosis not present

## 2024-08-11 DIAGNOSIS — J189 Pneumonia, unspecified organism: Secondary | ICD-10-CM | POA: Diagnosis not present

## 2024-08-11 DIAGNOSIS — R059 Cough, unspecified: Secondary | ICD-10-CM | POA: Diagnosis not present

## 2024-08-11 DIAGNOSIS — R071 Chest pain on breathing: Secondary | ICD-10-CM | POA: Diagnosis not present

## 2024-08-11 LAB — CBC
HCT: 39.9 % (ref 36.0–46.0)
Hemoglobin: 12.6 g/dL (ref 12.0–15.0)
MCH: 26.3 pg (ref 26.0–34.0)
MCHC: 31.6 g/dL (ref 30.0–36.0)
MCV: 83.3 fL (ref 80.0–100.0)
Platelets: 218 K/uL (ref 150–400)
RBC: 4.79 MIL/uL (ref 3.87–5.11)
RDW: 14 % (ref 11.5–15.5)
WBC: 12 K/uL — ABNORMAL HIGH (ref 4.0–10.5)
nRBC: 0 % (ref 0.0–0.2)

## 2024-08-11 LAB — RESP PANEL BY RT-PCR (RSV, FLU A&B, COVID)  RVPGX2
Influenza A by PCR: NEGATIVE
Influenza B by PCR: NEGATIVE
Resp Syncytial Virus by PCR: NEGATIVE
SARS Coronavirus 2 by RT PCR: NEGATIVE

## 2024-08-11 LAB — BASIC METABOLIC PANEL WITH GFR
Anion gap: 12 (ref 5–15)
BUN: 12 mg/dL (ref 6–20)
CO2: 25 mmol/L (ref 22–32)
Calcium: 8.3 mg/dL — ABNORMAL LOW (ref 8.9–10.3)
Chloride: 105 mmol/L (ref 98–111)
Creatinine, Ser: 0.89 mg/dL (ref 0.44–1.00)
GFR, Estimated: >60 mL/min (ref >60)
Glucose, Bld: 95 mg/dL (ref 70–99)
Potassium: 3.8 mmol/L (ref 3.5–5.1)
Sodium: 141 mmol/L (ref 135–145)

## 2024-08-11 MED ORDER — MAGNESIUM SULFATE 2 GM/50ML IV SOLN
2.0000 g | Freq: Once | INTRAVENOUS | Status: AC
  Administered 2024-08-11: 2 g via INTRAVENOUS
  Filled 2024-08-11: qty 50

## 2024-08-11 MED ORDER — PREDNISONE 20 MG PO TABS: ORAL_TABLET | ORAL | 0 refills | Status: DC

## 2024-08-11 MED ORDER — AEROCHAMBER PLUS FLO-VU MISC
1.0000 | Freq: Once | Status: AC
  Administered 2024-08-11: 1

## 2024-08-11 MED ORDER — ALBUTEROL SULFATE HFA 108 (90 BASE) MCG/ACT IN AERS
2.0000 | INHALATION_SPRAY | Freq: Once | RESPIRATORY_TRACT | Status: AC
  Administered 2024-08-11: 2 via RESPIRATORY_TRACT
  Filled 2024-08-11: qty 6.7

## 2024-08-11 MED ORDER — METHYLPREDNISOLONE SODIUM SUCC 125 MG IJ SOLR
125.0000 mg | Freq: Once | INTRAMUSCULAR | Status: AC
  Administered 2024-08-11: 125 mg via INTRAVENOUS
  Filled 2024-08-11: qty 2

## 2024-08-11 MED ORDER — IPRATROPIUM-ALBUTEROL 0.5-2.5 (3) MG/3ML IN SOLN
3.0000 mL | Freq: Once | RESPIRATORY_TRACT | Status: AC
  Administered 2024-08-11: 3 mL via RESPIRATORY_TRACT
  Filled 2024-08-11: qty 3

## 2024-08-11 NOTE — ED Provider Notes (Signed)
 [place] EMERGENCY DEPARTMENT AT [facility] Provider Note   CSN: [id number] Arrival date & time: 08/11/24  8167     Patient presents with: Fatigue and Cough   Julie Berger is a 58 y.o. female.   58 yo F with a chief complaints of cough congestion she said going on for a couple weeks.  No fevers.  She was treated by her PCP and things got transiently better and then got worse again.  No known sick contacts.     Cough      Prior to Admission medications   Medication Sig Start Date End Date Taking? Authorizing Provider  predniSONE  (DELTASONE ) 20 MG tablet 2 tabs po daily x 4 days 08/11/24  Yes Emil Share, DO  buPROPion  450 MG TB24 Take 1 tablet (450 mg total) by mouth daily. 08/02/24   Bacigalupo, Angela M, MD  diltiazem  (CARDIZEM  CD) 120 MG 24 hr capsule Take 1 capsule (120 mg total) by mouth daily. 08/02/24   Bacigalupo, Angela M, MD  HM LIDOCAINE  PATCH EX Apply 1 patch topically daily.    [provider]  naloxone [facility]) 4 MG/0.1ML LIQD nasal spray kit USE UTD FOR SUSPECTED OVERDOSE. 12/21/17   [provider]  OXYCONTIN  10 MG 12 hr tablet Take 10 mg by mouth every 12 (twelve) hours. 03/02/24   [provider]  pregabalin  (LYRICA ) 150 MG capsule Take 1 capsule (150 mg total) by mouth 3 (three) times daily. 01/26/20   Bacigalupo, Jon HERO, MD    Allergies: Nsaids    Review of Systems  Respiratory:  Positive for cough.     Updated Vital Signs BP 113/74   Pulse 93   Temp 98.7 F (37.1 C) (Oral)   Resp 14   SpO2 92%   Physical Exam Vitals and nursing note reviewed.  Constitutional:      General: She is not in acute distress.    Appearance: She is well-developed. She is not diaphoretic.  HENT:     Head: Normocephalic and atraumatic.  Eyes:     Pupils: Pupils are equal, round, and reactive to light.  Cardiovascular:     Rate and Rhythm: Normal rate and regular rhythm.     Heart sounds: No murmur heard.    No friction rub. No  gallop.  Pulmonary:     Effort: Pulmonary effort is normal.     Breath sounds: No wheezing or rales.     Comments: bronchospastic cough, no obvious wheezes Abdominal:     General: There is no distension.     Palpations: Abdomen is soft.     Tenderness: There is no abdominal tenderness.  Musculoskeletal:        General: No tenderness.     Cervical back: Normal range of motion and neck supple.  Skin:    General: Skin is warm and dry.  Neurological:     Mental Status: She is alert and oriented to person, place, and time.  Psychiatric:        Behavior: Behavior normal.     (all labs ordered are listed, but only abnormal results are displayed) Labs Reviewed  BASIC METABOLIC PANEL WITH GFR - Abnormal; Notable for the following components:      Result Value   Calcium 8.3 (*)    All other components within normal limits  CBC - Abnormal; Notable for the following components:   WBC 12.0 (*)    All other components within normal limits  RESP PANEL BY RT-PCR (RSV,  FLU A&B, COVID)  [id number]    EKG: EKG Interpretation Date/Time:  Thursday August 11 2024 18:49:40 EDT Ventricular Rate:  91 PR Interval:  138 QRS Duration:  91 QT Interval:  352 QTC Calculation: 433 R Axis:   68  Text Interpretation: Sinus rhythm Low voltage, precordial leads background noise TECHNICALLY DIFFICULT Otherwise no significant change Confirmed by Emil Share [id number]) on 08/11/2024 7:45:29 PM  Radiology: [facility] Chest 2 View Result Date: 08/11/2024 CLINICAL DATA:  Cough and weakness EXAM: CHEST - 2 VIEW COMPARISON:  08/22/2019 FINDINGS: Cardiac shadow is stable. The lungs are well aerated bilaterally. No focal infiltrate or effusion is seen. No bony abnormality is noted. IMPRESSION: No active cardiopulmonary disease. Electronically Signed   By: Oneil Devonshire M.D.   On: 08/11/2024 19:17     Procedures   Medications Ordered in the ED  albuterol  (VENTOLIN  HFA) 108 (90 Base) MCG/ACT inhaler 2 puff (has no  administration in time range)  Aerochamber Plus device 1 each (has no administration in time range)  ipratropium-albuterol  (DUONEB) 0.5-2.5 (3) MG/3ML nebulizer solution 3 mL (3 mLs Nebulization Given 08/11/24 1917)  methylPREDNISolone  sodium succinate (SOLU-MEDROL ) 125 mg/2 mL injection 125 mg (125 mg Intravenous Given 08/11/24 1938)  magnesium  sulfate IVPB 2 g 50 mL (0 g Intravenous Stopped 08/11/24 1953)                                    Medical Decision Making Amount and/or Complexity of Data Reviewed Labs: ordered. Radiology: ordered.  Risk Prescription drug management.   57 yoF with a chief complaints of cough shortness of breath going on for a couple weeks.  On record review the patient actually saw her PCP this week and told her that had been going on for months.  She has what sounds like a bronchospastic cough to me on exam though has no obvious wheezes.  Not requiring oxygen.  Will give a DuoNeb and steroids to reassess.  Patient feeling much better on repeat assessments.  Will give an inhaler spacer first dose prednisone .  PCP follow-up.  COVID flu and RSV are negative.  Mild leukocytosis.  No significant electrolyte abnormalities.  8:53 PM:  I have discussed the diagnosis/risks/treatment options with the patient.  Evaluation and diagnostic testing in the emergency department does not suggest an emergent condition requiring admission or immediate intervention beyond what has been performed at this time.  They will follow up with PCP. We also discussed returning to the ED immediately if new or worsening sx occur. We discussed the sx which are most concerning (e.g., sudden worsening pain, fever, inability to tolerate by mouth) that necessitate immediate return. Medications administered to the patient during their visit and any new prescriptions provided to the patient are listed below.  Medications given during this visit Medications  albuterol  (VENTOLIN  HFA) 108 (90 Base)  MCG/ACT inhaler 2 puff (has no administration in time range)  Aerochamber Plus device 1 each (has no administration in time range)  ipratropium-albuterol  (DUONEB) 0.5-2.5 (3) MG/3ML nebulizer solution 3 mL (3 mLs Nebulization Given 08/11/24 1917)  methylPREDNISolone  sodium succinate (SOLU-MEDROL ) 125 mg/2 mL injection 125 mg (125 mg Intravenous Given 08/11/24 1938)  magnesium  sulfate IVPB 2 g 50 mL (0 g Intravenous Stopped 08/11/24 1953)     The patient appears reasonably screen and/or stabilized for discharge and I doubt any other medical condition or other EMC requiring further screening, evaluation, or treatment in  the ED at this time prior to discharge.       Final diagnoses:  Wheezing-associated respiratory infection (WARI)    ED Discharge Orders          Ordered    predniSONE  (DELTASONE ) 20 MG tablet        08/11/24 2046               Emil Share, DO 08/11/24 2053

## 2024-08-11 NOTE — ED Triage Notes (Signed)
 BIBA for generalized weakness. Recent dx of  bronchitis- took round of prednisone - now feels fatigue, loss of appetite, has dry cough. 20 LAC, 500 NS given PTA. 130/68 bp 112 hr 22 rr 98% 2 lpm 114 cbg

## 2024-08-11 NOTE — Discharge Instructions (Signed)
Use your inhaler every 4 hours(4 puffs) while awake, return for sudden worsening shortness of breath, or if you need to use your inhaler more often.  ° °

## 2024-08-11 NOTE — Telephone Encounter (Signed)
 FYI Only or Action Required?: FYI only for provider.  Patient was last seen in primary care on 08/02/2024 by Myrla Jon HERO, MD.  Called Nurse Triage reporting Bronchitis.  Symptoms began yesterday.  Interventions attempted: Rest, hydration, or home remedies.  Symptoms are: rapidly worsening.  Triage Disposition: See HCP Within 4 Hours (Or PCP Triage)  Patient/caregiver understands and will follow disposition?: Yes  Reason for Disposition  [1] MILD difficulty breathing (e.g., minimal/no SOB at rest, SOB with walking, pulse < 100) AND [2] still present when not coughing  Wheezing is present  Answer Assessment - Initial Assessment Questions Patient states bronchitis cough resolved for one day. Cough returned yesterday, is now constant. Patient has a deep, barking cough. Patient states she has SOB that resolves once at rest, also reports occasional wheezing. Patient is unable to speak in full sentences due to severity of cough. No same day visits available. Pt was agreeable to going to UC. This RN provided patient with closest [facility] to her home. ED precautions reviewed, pt verbalized understanding.   1. ONSET: When did the cough begin?      Yesterday  2. SEVERITY: How bad is the cough today?      Severe, constant  3. SPUTUM: Describe the color of your sputum (e.g., none, dry cough; clear, white, yellow, green)     Denies  4. HEMOPTYSIS: Are you coughing up any blood? If Yes, ask: How much? (e.g., flecks, streaks, tablespoons, etc.)     Denies  5. DIFFICULTY BREATHING: Are you having difficulty breathing? If Yes, ask: How bad is it? (e.g., mild, moderate, severe)      Mild wheezing  6. FEVER: Do you have a fever? If Yes, ask: What is your temperature, how was it measured, and when did it start?     Denies  Protocols used: Cough - Acute Non-Productive-A-AH

## 2024-08-11 NOTE — Telephone Encounter (Signed)
 First attempt; no answer   Copied from CRM [id number]. Topic: Clinical - Medical Advice >> Aug 11, 2024 12:47 PM Cleave MATSU wrote: Reason for CRM: pt said she took the medication for bronchitis she felt better for a day but now its coming right back she wants to know what she should do. Please call pt to assist

## 2024-08-12 ENCOUNTER — Ambulatory Visit: Payer: Self-pay

## 2024-08-12 NOTE — Telephone Encounter (Signed)
 Noted

## 2024-08-12 NOTE — Telephone Encounter (Signed)
 FYI Only or Action Required?: FYI only for provider.  Patient was last seen in primary care on 08/02/2024 by Myrla Jon HERO, MD.  Called Nurse Triage reporting Medication Question.  Symptoms began n/a.  Interventions attempted: Other: n/a.  Symptoms are: stable.  Triage Disposition: Information or Advice Only Call  Patient/caregiver understands and will follow disposition?: Yes Reason for Disposition  Caller has medicine question, adult has minor symptoms, caller declines triage, AND triager answers question  Answer Assessment - Initial Assessment Questions Advised patient she can take OTC cough medicine in the mean time until she can get her prescription of Prednisone .  1. NAME of MEDICINE: What medicine(s) are you calling about?     OTC cough medicine  2. QUESTION: What is your question? (e.g., double dose of medicine, side effect)     Wondering if can take OTC cough medicine because cannot get prednisone  until Sunday  3. PRESCRIBER: Who prescribed the medicine? Reason: if prescribed by specialist, call should be referred to that group.     ED  4. SYMPTOMS: Do you have any symptoms? If Yes, ask: What symptoms are you having?  How bad are the symptoms (e.g., mild, moderate, severe)     Nothing new or worsening, still experiencing cough and heaviness.  Protocols used: Medication Question Call-A-AH  Copied from CRM #[id number]. Topic: Clinical - Medication Question >> Aug 12, 2024  5:05 PM Winona R wrote: Pt has been triaged yesterday and went to the ED however she has a questions about whether or not she can take over the counter medications together.  [facility] is closed and pt can't get her prescription until Sunday

## 2024-08-15 ENCOUNTER — Encounter: Payer: Self-pay | Admitting: Family Medicine

## 2024-08-15 ENCOUNTER — Ambulatory Visit: Admitting: Family Medicine

## 2024-08-15 VITALS — BP 118/92 | HR 86 | Wt 192.7 lb

## 2024-08-15 DIAGNOSIS — J40 Bronchitis, not specified as acute or chronic: Secondary | ICD-10-CM

## 2024-08-15 MED ORDER — FLUTICASONE FUROATE-VILANTEROL 200-25 MCG/ACT IN AEPB: 1.0000 | INHALATION_SPRAY | Freq: Every day | RESPIRATORY_TRACT | 11 refills | Status: DC

## 2024-08-15 NOTE — Telephone Encounter (Signed)
 Noted

## 2024-08-15 NOTE — Progress Notes (Signed)
 Established patient visit   Patient: Julie Berger   DOB: [birth year]   58 y.o. Female  MRN: [id number] Visit Date: 08/15/2024  Today's healthcare provider: Jon Eva, MD   Chief Complaint  Patient presents with   Hospitalization Follow-up    Patient was seen at [facility] on 08/11/24. She was treated for fatigue and cough . Treatment for this included DG Chest 2 View, EKG 12 Lead, albuterol , ipratropium-albuterol , magnesium  sulfate, methylPREDNISolone  sodium succinate She reports fair compliance with treatment. Just started prednisone  2 days ago due to mixup with pharmacy She reports this condition is  coming and going, she doesn't feel better and at times it feels worse.   Subjective    HPI HPI     Hospitalization Follow-up    Additional comments: Patient was seen at [facility] on 08/11/24. She was treated for fatigue and cough . Treatment for this included DG Chest 2 View, EKG 12 Lead, albuterol , ipratropium-albuterol , magnesium  sulfate, methylPREDNISolone  sodium succinate She reports fair compliance with treatment. Just started prednisone  2 days ago due to mixup with pharmacy She reports this condition is  coming and going, she doesn't feel better and at times it feels worse.      Last edited by Lilian Fitzpatrick, CMA on 08/15/2024 10:58 AM.       Discussed the use of AI scribe software for clinical note transcription with the patient, who gave verbal consent to proceed.  History of Present Illness   Julie Berger is a 58 year old female who presents with persistent cough and dyspnea.  Symptoms began three months ago with a wheezing cough, significant dyspnea, and substernal chest heaviness. There is no fever, body aches, or chills. A seven-day course of prednisone  initially improved symptoms, but she returned and worsened, necessitating an ER visit four days ago. In the ER, she received a magnesium  drip, albuterol , and prednisone , which  provided some relief.  Currently, she experiences chest heaviness, wheezing, and labored breathing. She uses albuterol  as needed, with initial confusion about dosing. She uses up to eight cough drops daily for dry mouth.  She is a former smoker, having quit 25 years ago. She has had difficulty obtaining her buprenorphine prescription due to pharmacy delays, although it has been approved.         Medications: Outpatient Medications Prior to Visit  Medication Sig   buPROPion  450 MG TB24 Take 1 tablet (450 mg total) by mouth daily.   diltiazem  (CARDIZEM  CD) 120 MG 24 hr capsule Take 1 capsule (120 mg total) by mouth daily.   HM LIDOCAINE  PATCH EX Apply 1 patch topically daily.   naloxone (NARCAN) 4 MG/0.1ML LIQD nasal spray kit USE UTD FOR SUSPECTED OVERDOSE.   OXYCONTIN  10 MG 12 hr tablet Take 10 mg by mouth every 12 (twelve) hours.   predniSONE  (DELTASONE ) 20 MG tablet 2 tabs po daily x 4 days   pregabalin  (LYRICA ) 150 MG capsule Take 1 capsule (150 mg total) by mouth 3 (three) times daily.   No facility-administered medications prior to visit.    Review of Systems     Objective    BP (!) 118/92 (BP Location: Left Arm, Patient Position: Sitting, Cuff Size: Normal)   Pulse 86   Wt 192 lb 11.2 oz (87.4 kg)   SpO2 97%   BMI 33.08 kg/m    Physical Exam Vitals reviewed.  Constitutional:      General: She is not in acute distress.  Appearance: Normal appearance. She is well-developed. She is not diaphoretic.  HENT:     Head: Normocephalic and atraumatic.  Eyes:     General: No scleral icterus.    Conjunctiva/sclera: Conjunctivae normal.  Neck:     Thyroid: No thyromegaly.  Cardiovascular:     Rate and Rhythm: Normal rate and regular rhythm.     Heart sounds: Normal heart sounds. No murmur heard. Pulmonary:     Effort: Pulmonary effort is normal. No respiratory distress.     Comments: No wheeze, diminished breath sounds in b/l bases Musculoskeletal:     Cervical  back: Neck supple.     Right lower leg: No edema.     Left lower leg: No edema.  Lymphadenopathy:     Cervical: No cervical adenopathy.  Skin:    General: Skin is warm and dry.     Findings: No rash.  Neurological:     Mental Status: She is alert and oriented to person, place, and time. Mental status is at baseline.  Psychiatric:        Mood and Affect: Mood normal.        Behavior: Behavior normal.      No results found for any visits on 08/15/24.  Assessment & Plan     Problem List Items Addressed This Visit   None Visit Diagnoses       Bronchitis    -  Primary          Acute bronchitis with persistent cough and dyspnea Acute bronchitis with persistent cough and dyspnea for three months, characterized by wheezing, chest heaviness, and labored breathing, primarily substernal. Previous treatment with prednisone  and albuterol  provided temporary relief. Recent ER visit included magnesium  drip, breathing treatment, and prednisone . Current symptoms include severe dyspnea and chest heaviness. Chest x-ray ruled out pneumonia. Current treatment with albuterol  is not providing adequate relief. - Prescribe Breo (inhaled corticosteroid and long-acting beta-agonist) one puff once daily to target inflammation and improve airflow. - Continue prednisone  as prescribed. - Use albuterol  as needed for rescue. - If symptoms persist after one month on Breo, refer to pulmonology.  Major depressive disorder on bupropion  therapy Major depressive disorder managed with bupropion . She reports issues with pharmacy filling prescription despite prior authorization approval. - Advise her to contact pharmacy to resolve bupropion  prescription issue. - Monitor medication availability and address any further delays.       Return if symptoms worsen or fail to improve.       Jon Eva, MD  [facility] [phone] (phone) [phone] (fax)  [facility]

## 2024-08-16 ENCOUNTER — Telehealth: Payer: Self-pay

## 2024-08-16 ENCOUNTER — Other Ambulatory Visit (HOSPITAL_COMMUNITY): Payer: Self-pay

## 2024-08-16 MED ORDER — FLUTICASONE-SALMETEROL 250-50 MCG/ACT IN AEPB: 1.0000 | INHALATION_SPRAY | Freq: Two times a day (BID) | RESPIRATORY_TRACT | 1 refills | Status: DC

## 2024-08-16 NOTE — Telephone Encounter (Signed)
 Pharmacy Patient Advocate Encounter   Received notification from Onbase that prior authorization for Breo Ellipta  200-25 mcg/act inhaler is required/requested.   Insurance verification completed.   The patient is insured through [employer] .   Per test claim:  Advair Diskus, Advair HFA, Dulera or Symbicort  is preferred by the insurance.  If suggested medication is appropriate, Please send in a new RX and discontinue this one. If not, please advise as to why it's not appropriate so that we may request a Prior Authorization. Please note, some preferred medications may still require a PA.  If the suggested medications have not been trialed and there are no contraindications to their use, the PA will not be submitted, as it will not be approved.

## 2024-08-17 NOTE — Telephone Encounter (Signed)
 Advised

## 2024-08-24 DIAGNOSIS — J40 Bronchitis, not specified as acute or chronic: Secondary | ICD-10-CM

## 2024-08-24 HISTORY — DX: Bronchitis, not specified as acute or chronic: J40

## 2024-08-30 ENCOUNTER — Ambulatory Visit

## 2024-09-15 ENCOUNTER — Telehealth: Payer: Self-pay

## 2024-09-15 DIAGNOSIS — Z9689 Presence of other specified functional implants: Secondary | ICD-10-CM

## 2024-09-15 NOTE — Telephone Encounter (Addendum)
 I was contacted by Garnette Fischer (rep with [employer]) to request a new patient appointment with Dr Claudene. Per Garnette, Ms [place] internal pulse generator is dead. She would need an IPG replacement with possible lead revision.  Per Garnette, She was implanted in 2019 I believe in [place].  Since her IPG is dead I'm not sure if there are any impedance issues with her leads but she also shared that leads were potentially in a different area and her coverage wasn't as good after a few months. So I don't have a lot of insight but there would be a potential that she wanted different coverage so we may have to replace the lead if it was bad and/or adjust slightly

## 2024-09-15 NOTE — Telephone Encounter (Signed)
 Dr Claudene, from what I can see in Care Everywhere, the battery was replaced in 2019. The only imaging we have is a chest x-ray, but you cannot visualize much of the stimulator.   Based on the potential need for revision, I want to confirm it is OK to schedule a new patient appointment with you. If so, would you want her to come early for any particular x-rays?

## 2024-09-16 NOTE — Telephone Encounter (Signed)
 Please offer her a new patient appointment with Dr Claudene, with xrays prior (xrays have been ordered).  Garnette with [employer] said he thinks her leads are placed at the T8 area but he will check with the tech team to see if there is a note of it.

## 2024-09-16 NOTE — Telephone Encounter (Signed)
 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 We do not have any imaging other than the chest xray in her chart, which you can barely visualize any of the stimulator. I sent a powerhsare request to [facility] to see if they have any imaging and I am waiting on a response.   I know we can do battery replacements. I wanted to confirm that it would be OK to schedule her based on the fact that it may be a potential revision.

## 2024-09-16 NOTE — Telephone Encounter (Addendum)
 [facility] responded that they do not have any imaging for this patient. Also sent a message to [facility] and [facility] asking to send images if they had any and I have not received a response.

## 2024-09-17 NOTE — Progress Notes (Unsigned)
 Cardiology Office Note  Date:  09/19/2024   ID:  Mckinzie, Saksa [birth year], MRN [id number]  PCP:  Myrla Jon HERO, MD   Chief Complaint  Patient presents with   New Patient (Initial Visit)    Self referral to establish care for Paroxysmal A-Fib & HTN. Former patient at [facility].  Patient is needing to have a clearance for a generator change out for her spinal cord stimulator.  Patient c/o chest pain x 1 month ago & she has noticed her blood pressure fluctuating.     HPI:  Julie Berger is a 58 y.o. female with past medical history of: GERD Chronic pain syndrome/fibromyalgia Former smoker, quit 25 years ago Depression Paroxysmal atrial fibrillation Who presents by referral from Dr. Myrla for consultation of her paroxysmal atrial fibrillation  From [place] , procedures back then on esophagus Esophageal cancer, treated with light ablation Moved to [place]  2018 Chest pain 2020  Previously seen by cardiology at [facility] July 2025 diagnosed with atrial fibrillation following endoscopy and foreign body removal 02/02/2020.  CHA2DS2-VASc score of 1, not on chronic anticoagulation  On Cardizem  ER 120 daily On this regiment for the most part has been maintaining normal sinus rhythm with rare episodes of tachycardia concerning for A-fib  Maintaining normal sinus rhythm on EKGs September 2025 and today   seen at [facility] on 08/11/24. She was treated for fatigue and cough, bronchitis Feels her bronchitis was causing some chest discomfort in the setting of coughing which is now resolved Treatment included DG Chest 2 View, EKG 12 Lead, albuterol , ipratropium-albuterol , magnesium  sulfate, methylPREDNISolone  sodium succinate Now on Advair  Last afib episode unclear Rare palpitations Comes on with stress 2-3 in a week, then none for weeks Spells last variable time 30 min or more  Needs spinal cord stimulator change out: battery dead Called  [facility]  EKG personally reviewed by myself on todays visit EKG Interpretation Date/Time:  Monday September 19 2024 14:00:53 EDT Ventricular Rate:  100 PR Interval:  116 QRS Duration:  74 QT Interval:  356 QTC Calculation: 459 R Axis:   17  Text Interpretation: Normal sinus rhythm Normal ECG When compared with ECG of 11-Aug-2024 18:49, No significant change was found Confirmed by Perla Lye [phone]) on 09/19/2024 2:11:56 PM    Past Medical History:  Diagnosis Date   Atrial fibrillation (HCC)    Barrett esophagus    Blood transfusion without reported diagnosis    Cancer (HCC)    cervical   Chronic pain    Complex regional pain syndrome I    Fibromyalgia    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   Other foreign object in esophagus causing other injury, initial encounter    RSD (reflex sympathetic dystrophy) 2011   CRPS     PMH:   has a past medical history of Atrial fibrillation (HCC), Barrett esophagus, Blood transfusion without reported diagnosis, Cancer (HCC), Chronic pain, Complex regional pain syndrome I, Fibromyalgia, Other foreign object in esophagus causing other injury, initial encounter, and RSD (reflex sympathetic dystrophy) (2011).   PSH:    Past Surgical History:  Procedure Laterality Date   BACK SURGERY     x 3   battery replacement for spinal cord stimulator  2019   [facility]   CHOLECYSTECTOMY     ESOPHAGOGASTRODUODENOSCOPY (EGD) WITH PROPOFOL  N/A 02/02/2020   Procedure: ESOPHAGOGASTRODUODENOSCOPY (EGD) WITH PROPOFOL ;  Surgeon: Jinny Carmine, MD;  Location: [facility] ENDOSCOPY;  Service: Endoscopy;  Laterality: N/A;   HIATAL HERNIA  REPAIR     HYSTERECTOMY ABDOMINAL WITH SALPINGO-OOPHORECTOMY     KNEE ARTHROSCOPY Left    SPINAL CORD STIMULATOR IMPLANT  2011   [facility], in [place]    TONSILLECTOMY     TRANSORAL INCISIONLESS FUNDOPLICATION     ULNAR NERVE TRANSPOSITION Left     Current Outpatient Medications  Medication Sig Dispense Refill    buPROPion  450 MG TB24 Take 1 tablet (450 mg total) by mouth daily. 90 tablet 1   diltiazem  (CARDIZEM  CD) 120 MG 24 hr capsule Take 1 capsule (120 mg total) by mouth daily. 90 capsule 1   fluticasone -salmeterol (ADVAIR DISKUS) 250-50 MCG/ACT AEPB Inhale 1 puff into the lungs in the morning and at bedtime. 60 each 1   HM LIDOCAINE  PATCH EX Apply 1 patch topically daily.     naloxone (NARCAN) 4 MG/0.1ML LIQD nasal spray kit USE UTD FOR SUSPECTED OVERDOSE.     OXYCONTIN  10 MG 12 hr tablet Take 10 mg by mouth every 12 (twelve) hours.     pregabalin  (LYRICA ) 150 MG capsule Take 1 capsule (150 mg total) by mouth 3 (three) times daily. 90 capsule 1   No current facility-administered medications for this visit.    Allergies:   Nsaids   Social History:  The patient  reports that she quit smoking about 25 years ago. Her smoking use included cigarettes. She started smoking about 45 years ago. She has a 20 pack-year smoking history. She has never used smokeless tobacco. She reports that she does not currently use alcohol . She reports that she does not use drugs.   Family History:   family history includes Bone cancer in her brother; Cancer in her brother; Dementia in her mother; Diabetes in her brother; Fibromyalgia in her mother; Heart attack in her father; Hypertension in her father; Lupus in her mother; Mitral valve prolapse in her father.   Review of Systems: Review of Systems  Constitutional: Negative.   HENT: Negative.    Respiratory: Negative.    Cardiovascular: Negative.   Gastrointestinal: Negative.   Musculoskeletal: Negative.   Neurological: Negative.   Psychiatric/Behavioral: Negative.    All other systems reviewed and are negative.   PHYSICAL EXAM: VS:  BP 120/82 (BP Location: Right Arm, Patient Position: Sitting, Cuff Size: Normal)   Pulse 100   Ht 5' 4 (1.626 m)   Wt 197 lb 6 oz (89.5 kg)   SpO2 97%   BMI 33.88 kg/m  , BMI Body mass index is 33.88 kg/m. GEN: Well  nourished, well developed, in no acute distress HEENT: normal Neck: no JVD, carotid bruits, or masses Cardiac: RRR; no murmurs, rubs, or gallops,no edema  Respiratory:  clear to auscultation bilaterally, normal work of breathing GI: soft, nontender, nondistended, + BS MS: no deformity or atrophy Skin: warm and dry, no rash Neuro:  Strength and sensation are intact Psych: euthymic mood, full affect  Recent Labs: 08/02/2024: ALT 15 08/11/2024: BUN 12; Creatinine, Ser 0.89; Hemoglobin 12.6; Platelets 218; Potassium 3.8; Sodium 141    Lipid Panel Lab Results  Component Value Date   CHOL 228 (H) 08/02/2024   HDL 42 08/02/2024   LDLCALC 125 (H) 08/02/2024   TRIG 345 (H) 08/02/2024    Wt Readings from Last 3 Encounters:  09/19/24 197 lb 6 oz (89.5 kg)  08/15/24 192 lb 11.2 oz (87.4 kg)  08/02/24 194 lb (88 kg)     ASSESSMENT AND PLAN:  Problem List Items Addressed This Visit       Cardiology Problems  Paroxysmal atrial fibrillation (HCC) - Primary   Relevant Orders   EKG 12-Lead (Completed)   Mixed hyperlipidemia     Other   Fibromyalgia   MDD (major depressive disorder)   Prediabetes   Obesity   Chronic pain, spinal cord stimulator Acceptable risk for spinal cord stimulator change out, battery dead per her readings on the iPad No further cardiac testing needed prior to procedure  Paroxysmal atrial fibrillation Reports long history of atrial fibrillation dating back to before 2018 when she was in [place]  Rare episodes, does not typically last very long Has not been on anticoagulation given low CHA2DS2-VASc 1 Tolerating diltiazem  ER 120 daily Reports typically blood pressure runs low at home sometimes 100 systolic - We have recommended she take diltiazem  short acting 60 mg as needed for breakthrough tachypalpitations consistent with atrial fibrillation - For more frequent episodes recommend she call our office  Hyperlipidemia Reports she is currently not on a  medication Total cholesterol 230 We have suggested she discuss with primary care We did discuss calcium scoring for risk stratification, she is not particularly interested at this time but will call us  if she changes her mind  Essential hypertension Blood pressure is well controlled on today's visit. No changes made to the medications.  GERD Currently not on PPI Reports stable symptoms  Seen in consultation for Dr. Bacigalupo for consultation of her paroxysmal atrial fibrillation will be referred back to her office for ongoing care of the issues detailed above  Signed, Julie Berger, M.D., Ph.D. [facility] [place], [place] [phone]

## 2024-09-19 ENCOUNTER — Encounter: Payer: Self-pay | Admitting: Cardiovascular Disease

## 2024-09-19 ENCOUNTER — Ambulatory Visit: Attending: Cardiovascular Disease | Admitting: Cardiovascular Disease

## 2024-09-19 VITALS — BP 120/82 | HR 100 | Ht 64.0 in | Wt 197.4 lb

## 2024-09-19 DIAGNOSIS — F331 Major depressive disorder, recurrent, moderate: Secondary | ICD-10-CM | POA: Diagnosis not present

## 2024-09-19 DIAGNOSIS — M797 Fibromyalgia: Secondary | ICD-10-CM | POA: Insufficient documentation

## 2024-09-19 DIAGNOSIS — E66811 Obesity, class 1: Secondary | ICD-10-CM | POA: Diagnosis not present

## 2024-09-19 DIAGNOSIS — R7303 Prediabetes: Secondary | ICD-10-CM | POA: Diagnosis not present

## 2024-09-19 DIAGNOSIS — I480 Paroxysmal atrial fibrillation: Secondary | ICD-10-CM | POA: Insufficient documentation

## 2024-09-19 DIAGNOSIS — Z6830 Body mass index (BMI) 30.0-30.9, adult: Secondary | ICD-10-CM | POA: Diagnosis not present

## 2024-09-19 DIAGNOSIS — E782 Mixed hyperlipidemia: Secondary | ICD-10-CM | POA: Diagnosis not present

## 2024-09-19 MED ORDER — DILTIAZEM HCL 60 MG PO TABS: 60.0000 mg | ORAL_TABLET | Freq: Three times a day (TID) | ORAL | 2 refills | Status: AC | PRN

## 2024-09-19 MED ORDER — DILTIAZEM HCL ER COATED BEADS 120 MG PO CP24: 120.0000 mg | ORAL_CAPSULE | Freq: Every day | ORAL | 3 refills | Status: AC

## 2024-09-19 NOTE — Telephone Encounter (Deleted)
 Left voicemail for scheduling

## 2024-09-19 NOTE — Telephone Encounter (Addendum)
 See below- left voicemail for scheduling

## 2024-09-19 NOTE — Patient Instructions (Addendum)
 Medication Instructions:  Take diltiazem  60 mg as needed for tachycardia/afib  If you need a refill on your cardiac medications before your next appointment, please call your pharmacy.   Lab work: No new labs needed  Testing/Procedures: No new testing needed  Follow-Up: At [facility], you and your health needs are our priority.  As part of our continuing mission to provide you with exceptional heart care, we have created designated Provider Care Teams.  These Care Teams include your primary Cardiologist (physician) and Advanced Practice Providers (APPs -  Physician Assistants and Nurse Practitioners) who all work together to provide you with the care you need, when you need it.  You will need a follow up appointment in 12 months  Providers on your designated Care Team:   Lonni Meager, NP Bernardino Bring, PA-C Cadence [place], [place]  COVID-19 Vaccine Information can be found at: [email or web address] For questions related to vaccine distribution or appointments, please email [email or web address] or call [phone].

## 2024-09-20 NOTE — Telephone Encounter (Signed)
 Patient scheduled with xrays

## 2024-09-26 DIAGNOSIS — M797 Fibromyalgia: Secondary | ICD-10-CM | POA: Diagnosis not present

## 2024-09-26 DIAGNOSIS — G894 Chronic pain syndrome: Secondary | ICD-10-CM | POA: Diagnosis not present

## 2024-09-26 DIAGNOSIS — M5416 Radiculopathy, lumbar region: Secondary | ICD-10-CM | POA: Diagnosis not present

## 2024-09-26 DIAGNOSIS — M25512 Pain in left shoulder: Secondary | ICD-10-CM | POA: Diagnosis not present

## 2024-09-26 DIAGNOSIS — I4891 Unspecified atrial fibrillation: Secondary | ICD-10-CM | POA: Diagnosis not present

## 2024-09-26 DIAGNOSIS — M79674 Pain in right toe(s): Secondary | ICD-10-CM | POA: Diagnosis not present

## 2024-09-26 DIAGNOSIS — M542 Cervicalgia: Secondary | ICD-10-CM | POA: Diagnosis not present

## 2024-09-26 DIAGNOSIS — G90529 Complex regional pain syndrome I of unspecified lower limb: Secondary | ICD-10-CM | POA: Diagnosis not present

## 2024-09-26 DIAGNOSIS — R110 Nausea: Secondary | ICD-10-CM | POA: Diagnosis not present

## 2024-09-26 DIAGNOSIS — F330 Major depressive disorder, recurrent, mild: Secondary | ICD-10-CM | POA: Diagnosis not present

## 2024-09-26 DIAGNOSIS — Z79891 Long term (current) use of opiate analgesic: Secondary | ICD-10-CM | POA: Diagnosis not present

## 2024-09-27 NOTE — H&P (View-Only) (Signed)
 Referring Physician:  Myrla Jon HERO, MD [address] [place],  [place] [address]  Primary Physician:  Myrla Jon HERO, MD  10/07/24  Discussed the use of AI scribe software for clinical note transcription with the patient, who gave verbal consent to proceed.  History of Present Illness Julie Berger is a 58 year old female with a spinal cord stimulator who presents with pain management issues due to a dead battery and frayed leads.  The spinal cord stimulator, implanted in 2011, had its last battery replacement in 2019. The non-rechargeable battery ceased functioning around March 2025, coinciding with increased pain levels, rated as an 'eight'. A specific incident of a 'thunk' sensation in bed is noted, suspected to be when the battery died. The stimulator effectively managed leg pain, primarily nerve pain from the neck down, with a significant increase in pain once it stopped working.  Initial placement of the stimulator was challenging due to a unique condition involving a covering over the spinal column, necessitating a larger incision and paddle repositioning. There is sensitivity around the current battery site.   Review of Systems:  A 10 point review of systems is negative, except for the pertinent positives and negatives detailed in the HPI.  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   Other foreign object in esophagus causing other injury, initial encounter    RSD (reflex sympathetic dystrophy) 2011   CRPS    Past Surgical History: Past Surgical History:  Procedure Laterality Date   BACK SURGERY     x 3   battery replacement for spinal cord stimulator  2019   [facility] at  [facility]   CHOLECYSTECTOMY     ESOPHAGOGASTRODUODENOSCOPY (EGD) WITH PROPOFOL  N/A 02/02/2020   Procedure: ESOPHAGOGASTRODUODENOSCOPY (EGD) WITH PROPOFOL ;  Surgeon: Jinny Carmine, MD;  Location: [facility] ENDOSCOPY;  Service: Endoscopy;  Laterality: N/A;   HIATAL HERNIA REPAIR     HYSTERECTOMY ABDOMINAL WITH SALPINGO-OOPHORECTOMY     KNEE ARTHROSCOPY Left    SPINAL CORD STIMULATOR IMPLANT  2011   [facility], in [place]    TONSILLECTOMY     TRANSORAL INCISIONLESS FUNDOPLICATION     ULNAR NERVE TRANSPOSITION Left     Allergies: Allergies as of 10/07/2024 - Review Complete 10/07/2024  Allergen Reaction Noted   Nsaids Shortness Of Breath 08/22/2019    Medications:  Current Outpatient Medications:    buPROPion  450 MG TB24, Take 1 tablet (450 mg total) by mouth daily., Disp: 90 tablet, Rfl: 1   diltiazem  (CARDIZEM  CD) 120 MG 24 hr capsule, Take 1 capsule (120 mg total) by mouth daily., Disp: 90 capsule, Rfl: 3   diltiazem  (CARDIZEM ) 60 MG tablet, Take 1 tablet (60 mg total) by mouth 3 (three) times daily as needed (for tachycardia)., Disp: 60 tablet, Rfl: 2   fluticasone -salmeterol (ADVAIR DISKUS) 250-50 MCG/ACT AEPB, Inhale 1 puff into the lungs in the morning and at bedtime., Disp: 60 each, Rfl: 1   HM LIDOCAINE  PATCH EX, Apply 1 patch topically daily., Disp: , Rfl:    naloxone (NARCAN) 4 MG/0.1ML LIQD nasal spray kit, USE UTD FOR SUSPECTED OVERDOSE., Disp: , Rfl:    OXYCONTIN  10 MG 12 hr tablet, Take 10 mg  by mouth every 12 (twelve) hours., Disp: , Rfl:    pregabalin  (LYRICA ) 150 MG capsule, Take 1 capsule (150 mg total) by mouth 3 (three) times daily., Disp: 90 capsule, Rfl: 1  Social History: Social History   Tobacco Use   Smoking status: Former    Current packs/day: 0.00    Average packs/day: 1 pack/day for 20.0 years (20.0 ttl pk-yrs)    Types: Cigarettes    Start date: 08/22/1979    Quit date: 08/22/1999    Years since quitting: 25.1   Smokeless tobacco: Never  Vaping Use   Vaping  status: Never Used  Substance Use Topics   Alcohol  use: Not Currently    Comment: social drinker   Drug use: Never    Family Medical History: Family History  Problem Relation Age of Onset   Dementia Mother    Lupus Mother    Fibromyalgia Mother    Heart attack Father    Mitral valve prolapse Father    Hypertension Father    Diabetes Brother    Cancer Brother        unknown type   Bone cancer Brother    Colon cancer Neg Hx    Breast cancer Neg Hx     Physical Examination: Vitals:   10/07/24 0954  BP: 120/82    General: Patient is in no apparent distress. Attention to examination is appropriate.  Neck:   Supple.  Full range of motion.  Respiratory: Patient is breathing without any difficulty.   NEUROLOGICAL:     Awake, alert, oriented to person, place, and time.  Speech is clear and fluent.   Cranial Nerves: Pupils equal round and reactive to light.  Facial tone is symmetric.  Facial sensation is symmetric. Shoulder shrug is symmetric. Tongue protrusion is midline.    Strength: No major deficits noted  Midline thoracic incison well healed. Left flank incision well healed.    Imaging: In process  Assessment and Plan Assessment & Plan Spinal cord stimulator battery depletion with some increased impedence Battery depleted and leads with mild impedence, causing significant pain and MRI incompatibility. Previous implantation was difficult due to scarring, complicating replacement. Risks of removal include potential inability to reimplant. She prefers battery replacement to avoid losing the stimulator. She would like to go forward with battery replacement - Replace spinal cord stimulator battery without changing location. - Deepen pocket to alleviate sensitivity. - Discuss with Elspeth possibility of full revision if necessary.  Penne MICAEL Sharps MD/MSCR Neurosurgery

## 2024-09-27 NOTE — Progress Notes (Signed)
 Referring Physician:  Myrla Jon HERO, MD [address] [place],  [place] [address]  Primary Physician:  Myrla Jon HERO, MD  10/07/24  Discussed the use of AI scribe software for clinical note transcription with the patient, who gave verbal consent to proceed.  History of Present Illness Julie Berger is a 58 year old female with a spinal cord stimulator who presents with pain management issues due to a dead battery and frayed leads.  The spinal cord stimulator, implanted in 2011, had its last battery replacement in 2019. The non-rechargeable battery ceased functioning around March 2025, coinciding with increased pain levels, rated as an 'eight'. A specific incident of a 'thunk' sensation in bed is noted, suspected to be when the battery died. The stimulator effectively managed leg pain, primarily nerve pain from the neck down, with a significant increase in pain once it stopped working.  Initial placement of the stimulator was challenging due to a unique condition involving a covering over the spinal column, necessitating a larger incision and paddle repositioning. There is sensitivity around the current battery site.   Review of Systems:  A 10 point review of systems is negative, except for the pertinent positives and negatives detailed in the HPI.  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   Other foreign object in esophagus causing other injury, initial encounter    RSD (reflex sympathetic dystrophy) 2011   CRPS    Past Surgical History: Past Surgical History:  Procedure Laterality Date   BACK SURGERY     x 3   battery replacement for spinal cord stimulator  2019   [facility] at  [facility]   CHOLECYSTECTOMY     ESOPHAGOGASTRODUODENOSCOPY (EGD) WITH PROPOFOL  N/A 02/02/2020   Procedure: ESOPHAGOGASTRODUODENOSCOPY (EGD) WITH PROPOFOL ;  Surgeon: Jinny Carmine, MD;  Location: [facility] ENDOSCOPY;  Service: Endoscopy;  Laterality: N/A;   HIATAL HERNIA REPAIR     HYSTERECTOMY ABDOMINAL WITH SALPINGO-OOPHORECTOMY     KNEE ARTHROSCOPY Left    SPINAL CORD STIMULATOR IMPLANT  2011   [facility], in [place]    TONSILLECTOMY     TRANSORAL INCISIONLESS FUNDOPLICATION     ULNAR NERVE TRANSPOSITION Left     Allergies: Allergies as of 10/07/2024 - Review Complete 10/07/2024  Allergen Reaction Noted   Nsaids Shortness Of Breath 08/22/2019    Medications:  Current Outpatient Medications:    buPROPion  450 MG TB24, Take 1 tablet (450 mg total) by mouth daily., Disp: 90 tablet, Rfl: 1   diltiazem  (CARDIZEM  CD) 120 MG 24 hr capsule, Take 1 capsule (120 mg total) by mouth daily., Disp: 90 capsule, Rfl: 3   diltiazem  (CARDIZEM ) 60 MG tablet, Take 1 tablet (60 mg total) by mouth 3 (three) times daily as needed (for tachycardia)., Disp: 60 tablet, Rfl: 2   fluticasone -salmeterol (ADVAIR DISKUS) 250-50 MCG/ACT AEPB, Inhale 1 puff into the lungs in the morning and at bedtime., Disp: 60 each, Rfl: 1   HM LIDOCAINE  PATCH EX, Apply 1 patch topically daily., Disp: , Rfl:    naloxone (NARCAN) 4 MG/0.1ML LIQD nasal spray kit, USE UTD FOR SUSPECTED OVERDOSE., Disp: , Rfl:    OXYCONTIN  10 MG 12 hr tablet, Take 10 mg  by mouth every 12 (twelve) hours., Disp: , Rfl:    pregabalin  (LYRICA ) 150 MG capsule, Take 1 capsule (150 mg total) by mouth 3 (three) times daily., Disp: 90 capsule, Rfl: 1  Social History: Social History   Tobacco Use   Smoking status: Former    Current packs/day: 0.00    Average packs/day: 1 pack/day for 20.0 years (20.0 ttl pk-yrs)    Types: Cigarettes    Start date: 08/22/1979    Quit date: 08/22/1999    Years since quitting: 25.1   Smokeless tobacco: Never  Vaping Use   Vaping  status: Never Used  Substance Use Topics   Alcohol  use: Not Currently    Comment: social drinker   Drug use: Never    Family Medical History: Family History  Problem Relation Age of Onset   Dementia Mother    Lupus Mother    Fibromyalgia Mother    Heart attack Father    Mitral valve prolapse Father    Hypertension Father    Diabetes Brother    Cancer Brother        unknown type   Bone cancer Brother    Colon cancer Neg Hx    Breast cancer Neg Hx     Physical Examination: Vitals:   10/07/24 0954  BP: 120/82    General: Patient is in no apparent distress. Attention to examination is appropriate.  Neck:   Supple.  Full range of motion.  Respiratory: Patient is breathing without any difficulty.   NEUROLOGICAL:     Awake, alert, oriented to person, place, and time.  Speech is clear and fluent.   Cranial Nerves: Pupils equal round and reactive to light.  Facial tone is symmetric.  Facial sensation is symmetric. Shoulder shrug is symmetric. Tongue protrusion is midline.    Strength: No major deficits noted  Midline thoracic incison well healed. Left flank incision well healed.    Imaging: In process  Assessment and Plan Assessment & Plan Spinal cord stimulator battery depletion with some increased impedence Battery depleted and leads with mild impedence, causing significant pain and MRI incompatibility. Previous implantation was difficult due to scarring, complicating replacement. Risks of removal include potential inability to reimplant. She prefers battery replacement to avoid losing the stimulator. She would like to go forward with battery replacement - Replace spinal cord stimulator battery without changing location. - Deepen pocket to alleviate sensitivity. - Discuss with Elspeth possibility of full revision if necessary.  Penne MICAEL Sharps MD/MSCR Neurosurgery

## 2024-10-07 ENCOUNTER — Encounter: Payer: Self-pay | Admitting: Neurosurgery

## 2024-10-07 ENCOUNTER — Ambulatory Visit (INDEPENDENT_AMBULATORY_CARE_PROVIDER_SITE_OTHER)

## 2024-10-07 ENCOUNTER — Ambulatory Visit: Admitting: Neurosurgery

## 2024-10-07 ENCOUNTER — Ambulatory Visit: Payer: Self-pay | Admitting: Neurosurgery

## 2024-10-07 ENCOUNTER — Other Ambulatory Visit: Payer: Self-pay

## 2024-10-07 VITALS — BP 120/82 | Wt 197.0 lb

## 2024-10-07 DIAGNOSIS — Z9689 Presence of other specified functional implants: Secondary | ICD-10-CM | POA: Diagnosis not present

## 2024-10-07 DIAGNOSIS — Z4542 Encounter for adjustment and management of neuropacemaker (brain) (peripheral nerve) (spinal cord): Secondary | ICD-10-CM | POA: Diagnosis not present

## 2024-10-07 DIAGNOSIS — G8929 Other chronic pain: Secondary | ICD-10-CM | POA: Insufficient documentation

## 2024-10-07 DIAGNOSIS — M5416 Radiculopathy, lumbar region: Secondary | ICD-10-CM | POA: Diagnosis not present

## 2024-10-07 DIAGNOSIS — G894 Chronic pain syndrome: Secondary | ICD-10-CM

## 2024-10-07 DIAGNOSIS — Z01818 Encounter for other preprocedural examination: Secondary | ICD-10-CM

## 2024-10-07 NOTE — Patient Instructions (Addendum)
 Please see below for information in regards to your upcoming surgery:   Planned surgery: Spinal Cord Stimulator Battery Exchange and Pocket Revision    Surgery date: 10/25/24 at [facility] (Medical Mall: [address], [place], [place] [address]) - you will find out your arrival time the business day before your surgery.    Pre-op appointment at [facility] Pre-admit Testing: you will receive a call with a date/time for this appointment. If you are scheduled for an in person appointment, Pre-admit Testing is located on the first floor of the Medical Arts building, [address], [address]. During this appointment, they will advise you which medications you can take the morning of surgery, and which medications you will need to hold for surgery. Labs (such as blood work, EKG) may be done at your pre-op appointment. You are not required to fast for these labs. Should you need to change your pre-op appointment, please call Pre-admit testing at [phone].      Surgical clearance: we will send a clearance form to Jon Eva, MD & [facility]. They may wish to see you in their office prior to signing the clearance form. If so, they may call you to schedule an appointment.       Common restrictions after spine surgery: No bending, lifting, or twisting ("BLT"). Avoid lifting objects heavier than 10 pounds for the first 6 weeks after surgery. Where possible, avoid household activities that involve lifting, bending, reaching, pushing, or pulling such as laundry, vacuuming, grocery shopping, and childcare. Try to arrange for help from friends and family for these activities while you heal. Do not drive while taking prescription pain medication. Weeks 6 through 12 after surgery: avoid lifting more than 25 pounds.      How to contact us :  If you have any questions/concerns before or after surgery, you can reach us  at [phone],  or you can send a mychart message. We can be reached by phone or mychart 8am-4pm, Monday-Friday.  *Please note: Calls after 4pm are forwarded to a third party answering service. Mychart messages are not routinely monitored during evenings, weekends, and holidays. Please call our office to contact the answering service for urgent concerns during non-business hours.    If you have FMLA/disability paperwork, please drop it off or fax it to [phone]   Appointments/FMLA & disability paperwork: Reche Hait, & Nichole Registered Nurse/Surgery scheduler: Kendelyn, RN & Katie, RN Certified Medical Assistants: Don, CMA, Elenor, CMA, Damien, CMA, & Auston, [place] Physician Assistants: Lyle Decamp, PA-C, Edsel Goods, PA-C & Glade Boys, PA-C Surgeons: Penne Sharps, MD & Reeves Daisy, MD    [facility] PREADMIT TESTING VISIT and SURGERY INFORMATION SHEET   Now that surgery has been scheduled you can anticipate several phone calls from [facility] services. A pharmacy technician will call you to verify your current list of medications taken at home.               The Pre-Service Center will call to verify your insurance information and to give you billing estimates and information.             The Preadmit Testing Office will be calling to schedule a visit to obtain information for the anesthesia team and provide instructions on preparation for surgery.  What can you expect for the Preadmit Testing Visit: Appointments may be scheduled in-person or by telephone.  If a telephone visit is scheduled, you may be asked to come  into the office to have lab tests or other studies performed.   This visit will not be completed any greater than 14 days prior to your surgery.  If your surgery has been scheduled for a future date, please do not be alarmed if we have not contacted you to schedule an appointment more than a month prior to the surgery date.    Please be prepared  to provide the following information during this appointment:            -Personal medical history                                               -Medication and allergy list            -Any history of problems with anesthesia              -Recent lab work or diagnostic studies            -Please notify us  of any needs we should be aware of to provide the best care possible           -You will be provided with instructions on how to prepare for your surgery.    On The Day of Surgery:  You must have a driver to take you home after surgery, you will be asked not to drive for 24 hours following surgery.  Taxi, Gisele and non-medical transport will not be acceptable means of transportation unless you have a responsible individual who will be traveling with you.  Visitors in the surgical area:   2 people will be able to visit you in your room once your preparation for surgery has been completed. During surgery, your visitors will be asked to wait in the Surgery Waiting Area.  It is not a requirement for them to stay, if they prefer to leave and come back.  Your visitor(s) will be given an update once the surgery has been completed.  No visitors are allowed in the initial recovery room to respect patient privacy and safety.  Once you are more awake and transfer to the secondary recovery area, or are transferred to an inpatient room, visitors will again be able to see you.  To respect and protect your privacy: We will ask on the day of surgery who your driver will be and what the contact number for that individual will be. We will ask if it is okay to share information with this individual, or if there is an alternative individual that we, or the surgeon, should contact to provide updates and information. If family or friends come to the surgical information desk requesting information about you, who you have not listed with us , no information will be given.   It may be helpful to designate someone as  the main contact who will be responsible for updating your other friends and family.    PREADMIT TESTING OFFICE: [phone] SAME DAY SURGERY: [phone] We look forward to caring for you before and throughout the process of your surgery.

## 2024-10-07 NOTE — Addendum Note (Signed)
 Addended by: Mackayla Mullins E on: 10/07/2024 12:21 PM   Modules accepted: Orders

## 2024-10-10 ENCOUNTER — Telehealth: Payer: Self-pay

## 2024-10-10 NOTE — Telephone Encounter (Signed)
 No answer, called pt to schedule her appt for pre-op clearance. Dr. B ok with her being seen by another provider. Left detailed message w callback to get her scheduled.

## 2024-10-11 DIAGNOSIS — Z1211 Encounter for screening for malignant neoplasm of colon: Secondary | ICD-10-CM | POA: Diagnosis not present

## 2024-10-13 ENCOUNTER — Encounter: Payer: Self-pay | Admitting: Family Medicine

## 2024-10-13 ENCOUNTER — Ambulatory Visit: Admitting: Family Medicine

## 2024-10-13 VITALS — BP 124/90 | HR 102 | Ht 64.0 in | Wt 194.1 lb

## 2024-10-13 DIAGNOSIS — Z4542 Encounter for adjustment and management of neuropacemaker (brain) (peripheral nerve) (spinal cord): Secondary | ICD-10-CM

## 2024-10-13 DIAGNOSIS — G90523 Complex regional pain syndrome I of lower limb, bilateral: Secondary | ICD-10-CM | POA: Diagnosis not present

## 2024-10-13 DIAGNOSIS — I480 Paroxysmal atrial fibrillation: Secondary | ICD-10-CM | POA: Diagnosis not present

## 2024-10-13 DIAGNOSIS — Z01818 Encounter for other preprocedural examination: Secondary | ICD-10-CM

## 2024-10-13 NOTE — Progress Notes (Signed)
 Established Patient Office Visit  Subjective   Patient ID: Julie Berger, female    DOB: 09/18/1966  Age: 58 y.o. MRN: [id number]  Chief Complaint  Patient presents with   Medical Clearance    Patient is here today to get surgical clearance to replace the battery of spinal cord stimulator.  States that her cardiologist Dr. Evalene Bake on Oct 27th, 2025, also an EKG was done during that visit.    Discussed the use of AI scribe software for clinical note transcription with the patient, who gave verbal consent to proceed.  History of Present Illness Julie Berger is a 58 year old female who presents for surgical clearance for spinal cord stimulator battery replacement. She is accompanied by her daughter, Levon.  Pt is a 58 y.o. female who is here for preoperative clearance for spinal cord stimulator, [place], Dr. Claudene. Chronic pain nerve cervical.   1) High Risk Cardiac Conditions  1) Recent MI - No.  2) Decompensated Heart Failure - No.  3) Unstable angina - No.  4) Symptomatic arrythmia - No.  5) Sx Valvular Disease - No.  2) Intermediate Risk Factors - DM, CKD, CVA, CHF, CAD - No.  2) Functional Status - > 4 mets (Walk, run, climb stairs) Yes - Non pain related.   3) Surgery Specific Risk - High  (Emergency, Vascular, Intra-abdominal, Extensive ops)          Intermediate (Carotid, Head and Neck, Orthopaedic )          Low (Endoscopic, Cataract, Breast )  4) Further Noninvasive evaluation -   1) EKG - Yes.   Performed by Cardiologist on 09/19/24.   1) Hx of CVA, CAD, DM, CKD  2) Echo - No.   1) Worsening dyspnea   3) Stress Testing - Active Cardiac Disease - No.  5) Need for medical therapy - Beta Blocker, Statins indicated ? No.          Patient Active Problem List   Diagnosis Date Noted   Chronic radicular low back pain 10/07/2024   Battery end of life of spinal cord stimulator 10/07/2024   Spinal cord stimulator status 10/07/2024   Mixed  hyperlipidemia 10/26/2020   Prediabetes 07/24/2020   Other chest pain    Encounter for long-term opiate analgesic use 01/18/2020   Gastroesophageal reflux disease with esophagitis 11/03/2019   Chronic pain 11/02/2019   History of cervical cancer 11/02/2019   Fibromyalgia 11/02/2019   Obesity 11/02/2019   Not currently working due to disabled status 11/02/2019   MDD (major depressive disorder) 11/02/2019   Complex regional pain syndrome I 11/02/2019   Paroxysmal atrial fibrillation (HCC) 11/02/2019   Mechanical complication of dorsal column stimulator 11/11/2018   Past Medical History:  Diagnosis Date   Atrial fibrillation (HCC)    Barrett esophagus    Blood transfusion without reported diagnosis    Cancer (HCC)    cervical   Chronic pain    Complex regional pain syndrome I    Fibromyalgia    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   Other foreign object in esophagus causing other injury, initial encounter    RSD (reflex sympathetic dystrophy) 2011   CRPS   Past Surgical History:  Procedure Laterality Date   BACK SURGERY     x 3   battery replacement for spinal cord stimulator  2019   [facility]   CHOLECYSTECTOMY     ESOPHAGOGASTRODUODENOSCOPY (EGD) WITH PROPOFOL  N/A 02/02/2020   Procedure:  ESOPHAGOGASTRODUODENOSCOPY (EGD) WITH PROPOFOL ;  Surgeon: Jinny Carmine, MD;  Location: [facility] ENDOSCOPY;  Service: Endoscopy;  Laterality: N/A;   HIATAL HERNIA REPAIR     HYSTERECTOMY ABDOMINAL WITH SALPINGO-OOPHORECTOMY     KNEE ARTHROSCOPY Left    SPINAL CORD STIMULATOR IMPLANT  2011   [facility], in [place]    TONSILLECTOMY     TRANSORAL INCISIONLESS FUNDOPLICATION     ULNAR NERVE TRANSPOSITION Left    Social History   Tobacco Use   Smoking status: Former    Current packs/day: 0.00    Average packs/day: 1 pack/day for 20.0 years (20.0 ttl pk-yrs)    Types: Cigarettes    Start date: 08/22/1979    Quit date: 08/22/1999    Years since quitting: 25.1   Smokeless  tobacco: Never  Vaping Use   Vaping status: Never Used  Substance Use Topics   Alcohol  use: Not Currently    Comment: social drinker   Drug use: Never   Allergies  Allergen Reactions   Nsaids Shortness Of Breath        10/13/2024    3:27 PM 08/02/2024    1:57 PM 04/04/2024    8:16 AM  Depression screen PHQ 2/9  Decreased Interest 1 1 1   Down, Depressed, Hopeless 1 1 1   PHQ - 2 Score 2 2 2   Altered sleeping 2 2 2   Tired, decreased energy 2 2 2   Change in appetite 0 3 1  Feeling bad or failure about yourself  0 1 1  Trouble concentrating 0 1 2  Moving slowly or fidgety/restless 0 1 0  Suicidal thoughts 0 1 0  PHQ-9 Score 6 13  10    Difficult doing work/chores Somewhat difficult Somewhat difficult Somewhat difficult     Data saved with a previous flowsheet row definition       10/13/2024    3:28 PM 08/02/2024    1:57 PM 04/04/2024    8:17 AM  GAD 7 : Generalized Anxiety Score  Nervous, Anxious, on Edge 1 2 1   Control/stop worrying 0 1 1  Worry too much - different things 1 2 1   Trouble relaxing 1 1 2   Restless 0 0 0  Easily annoyed or irritable 0 2 1  Afraid - awful might happen 0 0 1  Total GAD 7 Score 3 8 7   Anxiety Difficulty Somewhat difficult Somewhat difficult      ROS  Negative unless indicated in HPI   Objective:     BP (!) 124/90 (BP Location: Left Arm, Patient Position: Sitting, Cuff Size: Normal)   Pulse (!) 102   Ht 5' 4 (1.626 m)   Wt 194 lb 1.6 oz (88 kg)   BMI 33.32 kg/m  BP Readings from Last 3 Encounters:  10/13/24 (!) 124/90  10/07/24 120/82  09/19/24 120/82   Wt Readings from Last 3 Encounters:  10/13/24 194 lb 1.6 oz (88 kg)  10/07/24 197 lb (89.4 kg)  09/19/24 197 lb 6 oz (89.5 kg)      Physical Exam Constitutional:      General: She is not in acute distress.    Appearance: Normal appearance. She is not ill-appearing, toxic-appearing or diaphoretic.  HENT:     Head: Normocephalic.  Eyes:     Extraocular Movements:  Extraocular movements intact.     Pupils: Pupils are equal, round, and reactive to light.  Cardiovascular:     Rate and Rhythm: Normal rate.     Pulses: Normal pulses.     Heart  sounds: Normal heart sounds. No murmur heard.    No friction rub. No gallop.     Comments: Baseline afib, rate controlled Pulmonary:     Effort: No respiratory distress.     Breath sounds: No stridor. No wheezing, rhonchi or rales.  Chest:     Chest wall: No tenderness.  Musculoskeletal:     Right lower leg: No edema.     Left lower leg: No edema.  Skin:    General: Skin is warm and dry.     Capillary Refill: Capillary refill takes less than 2 seconds.  Neurological:     General: No focal deficit present.     Mental Status: She is alert and oriented to person, place, and time. Mental status is at baseline.     Cranial Nerves: No cranial nerve deficit.  Psychiatric:        Mood and Affect: Mood normal.        Behavior: Behavior normal.        Thought Content: Thought content normal.        Judgment: Judgment normal.      No results found for any visits on 10/13/24.    The 10-year ASCVD risk score (Arnett DK, et al., 2019) is: 4.1%    Assessment & Plan:  Pre-op examination  Paroxysmal atrial fibrillation (HCC)  Battery end of life of spinal cord stimulator  Complex regional pain syndrome type 1 of both lower extremities     Assessment and Plan Assessment & Plan Preoperative Evaluation for Spinal Cord Stimulator Battery Replacement Scheduled for spinal cord stimulator battery replacement on December 2nd, 2025. Cleared by cardiology for surgery. No recent myocardial infarction, heart failure, or coronary artery disease. No anticoagulation therapy.  Cleared by cardiologist Dr. Perla - Sent surgical clearance note to neurosurgeon Dr. Claudene - Ensured no medications need to be held prior to surgery  Complex regional pain syndrome Chronic nerve pain from neck down, present daily. Spinal cord  stimulator initially improved mobility from wheelchair to walking. Current stimulator has been non-functional for six months, leading to increased pain and flare-ups. Swelling and pain exacerbated during flare-ups- managed by neurosurgery - okay to Proceed with spinal cord stimulator battery replacement on December 2nd, 2025  Atrial fibrillation, rate controlled with diltiazem  - Continue diltiazem  180 mg daily - Use diltiazem  60 mg as needed for increased heart rate  I have independently evaluated patient.  Sarajean Richer is a 58 y.o. female who is low risk for a low risk surgery.  There are not modifiable risk factors (smoking, etc). Sharlyn Grill's RCRI/NSQIP calculation for MACE is: unavailable, procedure not listed.    Not Needed - CXR (if asx/healthy/no resp issues don't need) Done by Cardiology on 09/19/24 - approved by Dr. Gollan per patient. EKG - hx of rated controlled afib Okay to take diltiazem  day of surgery. - per PCP Dr. Myrla.    No follow-ups on file.   I, Curtis DELENA Boom, FNP, have reviewed all documentation for this visit. The documentation on 10/13/24 for the exam, diagnosis, procedures, and orders are all accurate and complete.   Curtis DELENA Boom, FNP

## 2024-10-19 ENCOUNTER — Encounter
Admission: RE | Admit: 2024-10-19 | Discharge: 2024-10-19 | Disposition: A | Discharge status: Home / Self Care | Source: Ambulatory Visit | Attending: Neurosurgery | Admitting: Neurosurgery

## 2024-10-19 DIAGNOSIS — Z4542 Encounter for adjustment and management of neuropacemaker (brain) (peripheral nerve) (spinal cord): Secondary | ICD-10-CM

## 2024-10-19 DIAGNOSIS — Z01818 Encounter for other preprocedural examination: Secondary | ICD-10-CM

## 2024-10-19 HISTORY — DX: Encounter for adjustment and management of neurostimulator: Z45.42

## 2024-10-19 HISTORY — DX: Presence of other specified functional implants: Z96.89

## 2024-10-19 HISTORY — DX: Malignant neoplasm of cervix uteri, unspecified: C53.9

## 2024-10-19 HISTORY — DX: Gastro-esophageal reflux disease without esophagitis: K21.9

## 2024-10-19 HISTORY — DX: Personal history of diseases of the blood and blood-forming organs and certain disorders involving the immune mechanism: Z86.2

## 2024-10-19 HISTORY — DX: Personal history of nicotine dependence: Z87.891

## 2024-10-19 NOTE — Patient Instructions (Signed)
 Your procedure is scheduled on:10-25-24 Tuesday Report to the Registration Desk on the 1st floor of the Medical Mall.Then proceed to the 2nd floor Surgery Desk To find out your arrival time, please call [phone] between 1PM - 3PM on:10-24-24 Monday If your arrival time is 6:00 am, do not arrive before that time as the Medical Mall entrance doors do not open until 6:00 am.  REMEMBER: Instructions that are not followed completely may result in serious medical risk, up to and including death; or upon the discretion of your surgeon and anesthesiologist your surgery may need to be rescheduled.  Do not eat food after midnight the night before surgery.  No gum chewing or hard candies.  You may however, drink CLEAR liquids up to 2 hours before you are scheduled to arrive for your surgery. Do not drink anything within 2 hours of your scheduled arrival time.  Clear liquids include: - water  - apple juice without pulp - gatorade (not RED colors) - black coffee or tea (Do NOT add milk or creamers to the coffee or tea) Do NOT drink anything that is not on this list.  One week prior to surgery:Stop NOW (10-19-24) Stop ANY OVER THE COUNTER supplements until after surgery.  Continue taking all of your other prescription medications up until the day of surgery.  ON THE DAY OF SURGERY ONLY TAKE THESE MEDICATIONS WITH SIPS OF WATER: -buPROPion   -OXYCONTIN   -pregabalin  (LYRICA )   No Alcohol  for 24 hours before or after surgery.  No Smoking including e-cigarettes for 24 hours before surgery.  No chewable tobacco products for at least 6 hours before surgery.  No nicotine patches on the day of surgery.  Do not use any recreational drugs for at least a week (preferably 2 weeks) before your surgery.  Please be advised that the combination of cocaine and anesthesia may have negative outcomes, up to and including death. If you test positive for cocaine, your surgery will be cancelled.  On the  morning of surgery brush your teeth with toothpaste and water, you may rinse your mouth with mouthwash if you wish. Do not swallow any toothpaste or mouthwash.  Use CHG Soap as directed on instruction sheet.  Do not wear jewelry, make-up, hairpins, clips or nail polish.  For welded (permanent) jewelry: bracelets, anklets, waist bands, etc.  Please have this removed prior to surgery.  If it is not removed, there is a chance that hospital personnel will need to cut it off on the day of surgery.  Do not wear lotions, powders, or perfumes.   Do not shave body hair from the neck down 48 hours before surgery.  Contact lenses, hearing aids and dentures may not be worn into surgery.  Do not bring valuables to the hospital. [facility] is not responsible for any missing/lost belongings or valuables.   Notify your doctor if there is any change in your medical condition (cold, fever, infection).  Wear comfortable clothing (specific to your surgery type) to the hospital.  After surgery, you can help prevent lung complications by doing breathing exercises.  Take deep breaths and cough every 1-2 hours. Your doctor may order a device called an Incentive Spirometer to help you take deep breaths. When coughing or sneezing, hold a pillow firmly against your incision with both hands. This is called "splinting." Doing this helps protect your incision. It also decreases belly discomfort.  If you are being admitted to the hospital overnight, leave your suitcase in the car. After surgery  it may be brought to your room.  In case of increased patient census, it may be necessary for you, the patient, to continue your postoperative care in the Same Day Surgery department.  If you are being discharged the day of surgery, you will not be allowed to drive home. You will need a responsible individual to drive you home and stay with you for 24 hours after surgery.   If you are taking public transportation, you will  need to have a responsible individual with you.  Please call the Pre-admissions Testing Dept. at [phone] if you have any questions about these instructions.  Surgery Visitation Policy:  Patients having surgery or a procedure may have two visitors.  Children under the age of 18 must have an adult with them who is not the patient.                                                                                                             Preparing for Surgery with CHLORHEXIDINE GLUCONATE (CHG) Soap  Chlorhexidine Gluconate (CHG) Soap  o An antiseptic cleaner that kills germs and bonds with the skin to continue killing germs even after washing  o Used for showering the night before surgery and morning of surgery  Before surgery, you can play an important role by reducing the number of germs on your skin.  CHG (Chlorhexidine gluconate) soap is an antiseptic cleanser which kills germs and bonds with the skin to continue killing germs even after washing.  Please do not use if you have an allergy to CHG or antibacterial soaps. If your skin becomes reddened/irritated stop using the CHG.  1. Shower the NIGHT BEFORE SURGERY with CHG soap.  2. If you choose to wash your hair, wash your hair first as usual with your normal shampoo.  3. After shampooing, rinse your hair and body thoroughly to remove the shampoo.  4. Use CHG as you would any other liquid soap. You can apply CHG directly to the skin and wash gently with a clean washcloth.  5. Apply the CHG soap to your body only from the neck down. Do not use on open wounds or open sores. Avoid contact with your eyes, ears, mouth, and genitals (private parts). Wash face and genitals (private parts) with your normal soap.  6. Wash thoroughly, paying special attention to the area where your surgery will be performed.  7. Thoroughly rinse your body with warm water.  8. Do not shower/wash with your normal soap after using and rinsing off  the CHG soap.  9. Do not use lotions, oils, etc., after showering with CHG.  10. Pat yourself dry with a clean towel.  11. Wear clean pajamas to bed the night before surgery.  12. Place clean sheets on your bed the night of your shower and do not sleep with pets.  13. Do not apply any deodorants/lotions/powders.  14. Please wear clean clothes to the hospital.  15. Remember to brush your teeth with your regular toothpaste.   Merchandiser, Retail to  address health-related social needs:  [email or web address]

## 2024-10-24 ENCOUNTER — Encounter
Admission: RE | Admit: 2024-10-24 | Discharge: 2024-10-24 | Disposition: A | Source: Ambulatory Visit | Attending: Neurosurgery | Admitting: Neurosurgery

## 2024-10-24 ENCOUNTER — Encounter: Payer: Self-pay | Admitting: Urgent Care

## 2024-10-24 DIAGNOSIS — G894 Chronic pain syndrome: Secondary | ICD-10-CM | POA: Diagnosis not present

## 2024-10-24 DIAGNOSIS — Z01812 Encounter for preprocedural laboratory examination: Secondary | ICD-10-CM | POA: Diagnosis not present

## 2024-10-24 DIAGNOSIS — M5416 Radiculopathy, lumbar region: Secondary | ICD-10-CM | POA: Diagnosis not present

## 2024-10-24 DIAGNOSIS — I4891 Unspecified atrial fibrillation: Secondary | ICD-10-CM | POA: Diagnosis not present

## 2024-10-24 DIAGNOSIS — M25512 Pain in left shoulder: Secondary | ICD-10-CM | POA: Diagnosis not present

## 2024-10-24 DIAGNOSIS — M797 Fibromyalgia: Secondary | ICD-10-CM | POA: Diagnosis not present

## 2024-10-24 DIAGNOSIS — M542 Cervicalgia: Secondary | ICD-10-CM | POA: Diagnosis not present

## 2024-10-24 DIAGNOSIS — F330 Major depressive disorder, recurrent, mild: Secondary | ICD-10-CM | POA: Diagnosis not present

## 2024-10-24 DIAGNOSIS — R110 Nausea: Secondary | ICD-10-CM | POA: Diagnosis not present

## 2024-10-24 DIAGNOSIS — M79674 Pain in right toe(s): Secondary | ICD-10-CM | POA: Diagnosis not present

## 2024-10-24 DIAGNOSIS — G90529 Complex regional pain syndrome I of unspecified lower limb: Secondary | ICD-10-CM | POA: Diagnosis not present

## 2024-10-24 DIAGNOSIS — Z01818 Encounter for other preprocedural examination: Secondary | ICD-10-CM

## 2024-10-24 DIAGNOSIS — Z79891 Long term (current) use of opiate analgesic: Secondary | ICD-10-CM | POA: Diagnosis not present

## 2024-10-24 LAB — SURGICAL PCR SCREEN
MRSA, PCR: NEGATIVE
Staphylococcus aureus: POSITIVE — AB

## 2024-10-24 MED ORDER — LACTATED RINGERS IV SOLN: INTRAVENOUS | Status: DC

## 2024-10-24 MED ORDER — VANCOMYCIN HCL IN DEXTROSE 1-5 GM/200ML-% IV SOLN
1000.0000 mg | Freq: Once | INTRAVENOUS | Status: AC
  Administered 2024-10-25: 1000 mg via INTRAVENOUS

## 2024-10-24 MED ORDER — ORAL CARE MOUTH RINSE: 15.0000 mL | Freq: Once | OROMUCOSAL | Status: AC

## 2024-10-24 MED ORDER — CHLORHEXIDINE GLUCONATE 0.12 % MT SOLN
15.0000 mL | Freq: Once | OROMUCOSAL | Status: AC
  Administered 2024-10-25: 15 mL via OROMUCOSAL

## 2024-10-25 ENCOUNTER — Other Ambulatory Visit: Payer: Self-pay

## 2024-10-25 ENCOUNTER — Ambulatory Visit
Admission: RE | Admit: 2024-10-25 | Discharge: 2024-10-25 | Disposition: A | Attending: Neurosurgery | Admitting: Neurosurgery

## 2024-10-25 ENCOUNTER — Ambulatory Visit: Admitting: Certified Registered"

## 2024-10-25 ENCOUNTER — Encounter
Admission: RE | Disposition: A | Discharge status: Home / Self Care | Payer: Self-pay | Source: Home / Self Care | Attending: Neurosurgery

## 2024-10-25 ENCOUNTER — Encounter: Payer: Self-pay | Admitting: Neurosurgery

## 2024-10-25 ENCOUNTER — Ambulatory Visit: Payer: Self-pay | Admitting: Urgent Care

## 2024-10-25 DIAGNOSIS — F32A Depression, unspecified: Secondary | ICD-10-CM | POA: Insufficient documentation

## 2024-10-25 DIAGNOSIS — I4891 Unspecified atrial fibrillation: Secondary | ICD-10-CM | POA: Insufficient documentation

## 2024-10-25 DIAGNOSIS — Z4542 Encounter for adjustment and management of neuropacemaker (brain) (peripheral nerve) (spinal cord): Secondary | ICD-10-CM | POA: Insufficient documentation

## 2024-10-25 DIAGNOSIS — M792 Neuralgia and neuritis, unspecified: Secondary | ICD-10-CM | POA: Diagnosis not present

## 2024-10-25 DIAGNOSIS — Z8249 Family history of ischemic heart disease and other diseases of the circulatory system: Secondary | ICD-10-CM | POA: Insufficient documentation

## 2024-10-25 DIAGNOSIS — K219 Gastro-esophageal reflux disease without esophagitis: Secondary | ICD-10-CM | POA: Insufficient documentation

## 2024-10-25 DIAGNOSIS — Z87891 Personal history of nicotine dependence: Secondary | ICD-10-CM | POA: Insufficient documentation

## 2024-10-25 DIAGNOSIS — G8929 Other chronic pain: Secondary | ICD-10-CM

## 2024-10-25 DIAGNOSIS — G894 Chronic pain syndrome: Secondary | ICD-10-CM | POA: Insufficient documentation

## 2024-10-25 DIAGNOSIS — Z01818 Encounter for other preprocedural examination: Secondary | ICD-10-CM

## 2024-10-25 DIAGNOSIS — Z9689 Presence of other specified functional implants: Secondary | ICD-10-CM

## 2024-10-25 HISTORY — PX: SPINAL CORD STIMULATOR BATTERY EXCHANGE: SHX6202

## 2024-10-25 LAB — COLOGUARD: COLOGUARD: POSITIVE — AB

## 2024-10-25 SURGERY — SPINAL CORD STIMULATOR BATTERY EXCHANGE
Anesthesia: General | Laterality: Left

## 2024-10-25 MED ORDER — SURGIFLO WITH THROMBIN (HEMOSTATIC MATRIX KIT) OPTIME
TOPICAL | Status: DC | PRN
  Administered 2024-10-25: 1 via TOPICAL

## 2024-10-25 MED ORDER — DEXAMETHASONE SOD PHOSPHATE PF 10 MG/ML IJ SOLN
INTRAMUSCULAR | Status: DC | PRN
  Administered 2024-10-25: 10 mg via INTRAVENOUS

## 2024-10-25 MED ORDER — CHLORHEXIDINE GLUCONATE 0.12 % MT SOLN
OROMUCOSAL | Status: AC
  Filled 2024-10-25: qty 15

## 2024-10-25 MED ORDER — FENTANYL CITRATE (PF) 100 MCG/2ML IJ SOLN
INTRAMUSCULAR | Status: AC
  Filled 2024-10-25: qty 2

## 2024-10-25 MED ORDER — OXYCODONE HCL 5 MG PO TABS
ORAL_TABLET | ORAL | Status: AC
  Filled 2024-10-25: qty 1

## 2024-10-25 MED ORDER — 0.9 % SODIUM CHLORIDE (POUR BTL) OPTIME
TOPICAL | Status: DC | PRN
  Administered 2024-10-25: 150 mL

## 2024-10-25 MED ORDER — KETAMINE HCL 50 MG/5ML IJ SOSY
PREFILLED_SYRINGE | INTRAMUSCULAR | Status: DC | PRN
  Administered 2024-10-25: 20 mg via INTRAVENOUS

## 2024-10-25 MED ORDER — OXYCODONE HCL 5 MG PO TABS
5.0000 mg | ORAL_TABLET | Freq: Four times a day (QID) | ORAL | 0 refills | Status: AC | PRN
  Filled 2024-10-25: qty 12, 3d supply, fill #0

## 2024-10-25 MED ORDER — SUGAMMADEX SODIUM 200 MG/2ML IV SOLN
INTRAVENOUS | Status: DC | PRN
  Administered 2024-10-25: 200 mg via INTRAVENOUS

## 2024-10-25 MED ORDER — FENTANYL CITRATE (PF) 100 MCG/2ML IJ SOLN
INTRAMUSCULAR | Status: DC | PRN
  Administered 2024-10-25 (×2): 50 ug via INTRAVENOUS

## 2024-10-25 MED ORDER — MIDAZOLAM HCL 2 MG/2ML IJ SOLN
INTRAMUSCULAR | Status: AC
  Filled 2024-10-25: qty 2

## 2024-10-25 MED ORDER — OXYCODONE HCL 5 MG PO TABS
5.0000 mg | ORAL_TABLET | Freq: Once | ORAL | Status: AC
  Administered 2024-10-25: 5 mg via ORAL

## 2024-10-25 MED ORDER — IRRISEPT - 450ML BOTTLE WITH 0.05% CHG IN STERILE WATER, USP 99.95% OPTIME
TOPICAL | Status: DC | PRN
  Administered 2024-10-25: 450 mL

## 2024-10-25 MED ORDER — FENTANYL CITRATE (PF) 100 MCG/2ML IJ SOLN
25.0000 ug | INTRAMUSCULAR | Status: DC | PRN
  Administered 2024-10-25 (×2): 50 ug via INTRAVENOUS

## 2024-10-25 MED ORDER — DROPERIDOL 2.5 MG/ML IJ SOLN: 0.6250 mg | Freq: Once | INTRAMUSCULAR | Status: DC | PRN

## 2024-10-25 MED ORDER — CEFAZOLIN IN SODIUM CHLORIDE 2-0.9 GM/100ML-% IV SOLN
2.0000 g | Freq: Once | INTRAVENOUS | Status: AC
  Administered 2024-10-25: 2 g via INTRAVENOUS

## 2024-10-25 MED ORDER — CEFAZOLIN SODIUM-DEXTROSE 2-4 GM/100ML-% IV SOLN
INTRAVENOUS | Status: AC
  Filled 2024-10-25: qty 100

## 2024-10-25 MED ORDER — BUPIVACAINE-EPINEPHRINE (PF) 0.5% -1:200000 IJ SOLN
INTRAMUSCULAR | Status: DC | PRN
  Administered 2024-10-25: 10 mL

## 2024-10-25 MED ORDER — PROPOFOL 10 MG/ML IV BOLUS
INTRAVENOUS | Status: AC
  Filled 2024-10-25: qty 20

## 2024-10-25 MED ORDER — SENNA 8.6 MG PO TABS
1.0000 | ORAL_TABLET | Freq: Two times a day (BID) | ORAL | 0 refills | Status: DC | PRN
  Filled 2024-10-25: qty 30, 15d supply, fill #0

## 2024-10-25 MED ORDER — KETAMINE HCL 50 MG/5ML IJ SOSY
PREFILLED_SYRINGE | INTRAMUSCULAR | Status: AC
  Filled 2024-10-25: qty 5

## 2024-10-25 MED ORDER — LIDOCAINE HCL (CARDIAC) PF 100 MG/5ML IV SOSY
PREFILLED_SYRINGE | INTRAVENOUS | Status: DC | PRN
  Administered 2024-10-25: 80 mg via INTRAVENOUS

## 2024-10-25 MED ORDER — ROCURONIUM BROMIDE 100 MG/10ML IV SOLN
INTRAVENOUS | Status: DC | PRN
  Administered 2024-10-25: 50 mg via INTRAVENOUS

## 2024-10-25 MED ORDER — VANCOMYCIN HCL IN DEXTROSE 1-5 GM/200ML-% IV SOLN
INTRAVENOUS | Status: AC
  Filled 2024-10-25: qty 200

## 2024-10-25 MED ORDER — PROPOFOL 1000 MG/100ML IV EMUL
INTRAVENOUS | Status: AC
  Filled 2024-10-25: qty 100

## 2024-10-25 MED ORDER — ONDANSETRON HCL 4 MG/2ML IJ SOLN
INTRAMUSCULAR | Status: DC | PRN
  Administered 2024-10-25: 4 mg via INTRAVENOUS

## 2024-10-25 MED ORDER — MIDAZOLAM HCL (PF) 2 MG/2ML IJ SOLN
INTRAMUSCULAR | Status: DC | PRN
  Administered 2024-10-25: 2 mg via INTRAVENOUS

## 2024-10-25 MED ORDER — PROPOFOL 10 MG/ML IV BOLUS
INTRAVENOUS | Status: DC | PRN
  Administered 2024-10-25: 160 mg via INTRAVENOUS

## 2024-10-25 SURGICAL SUPPLY — 36 items
BRUSH SCRUB EZ 4% CHG (MISCELLANEOUS) ×1 IMPLANT
BUR NEURO DRILL SOFT 3.0X3.8M (BURR) ×1 IMPLANT
CONTROLLER NEUROSTIM PATIENT (NEUROSURGERY SUPPLIES) IMPLANT
DERMABOND ADVANCED .7 DNX12 (GAUZE/BANDAGES/DRESSINGS) ×2 IMPLANT
DRAPE C-ARM XRAY 36X54 (DRAPES) ×1 IMPLANT
DRAPE LAPAROTOMY 100X77 ABD (DRAPES) ×1 IMPLANT
DRSG OPSITE POSTOP 4X6 (GAUZE/BANDAGES/DRESSINGS) IMPLANT
DRSG OPSITE POSTOP 4X8 (GAUZE/BANDAGES/DRESSINGS) IMPLANT
ELECTRODE REM PT RTRN 9FT ADLT (ELECTROSURGICAL) ×1 IMPLANT
FEE INTRAOP CADWELL SUPPLY NCS (MISCELLANEOUS) IMPLANT
FEE INTRAOP MONITOR IMPULS NCS (MISCELLANEOUS) IMPLANT
GENERATOR NEUROSTIM ETERNA 16 (Generator) IMPLANT
GLOVE BIOGEL PI IND STRL 7.0 (GLOVE) ×1 IMPLANT
GLOVE BIOGEL PI IND STRL 8 (GLOVE) ×1 IMPLANT
GLOVE SRG 8 PF TXTR STRL LF DI (GLOVE) ×1 IMPLANT
GLOVE SURG SYN 7.0 PF PI (GLOVE) ×1 IMPLANT
GLOVE SURG SYN 7.5 PF PI (GLOVE) ×1 IMPLANT
GOWN SRG LRG LVL 4 IMPRV REINF (GOWNS) ×1 IMPLANT
GOWN SRG XL LVL 3 NONREINFORCE (GOWNS) ×1 IMPLANT
KIT CHARGER NSTIM ETERNA SU (NEUROSURGERY SUPPLIES) IMPLANT
KIT SPINAL PRONEVIEW (KITS) ×1 IMPLANT
LAVAGE JET IRRISEPT WOUND (IRRIGATION / IRRIGATOR) ×1 IMPLANT
MANIFOLD NEPTUNE II (INSTRUMENTS) ×1 IMPLANT
MANUAL PATIENT NSTIM ETERNA SU (MISCELLANEOUS) IMPLANT
NDL SAFETY ECLIP 18X1.5 (MISCELLANEOUS) ×1 IMPLANT
NS IRRIG 500ML POUR BTL (IV SOLUTION) ×1 IMPLANT
PACK LAMINECTOMY ARMC (PACKS) ×1 IMPLANT
PAD ARMBOARD POSITIONER FOAM (MISCELLANEOUS) ×1 IMPLANT
STAPLER SKIN PROX 35W (STAPLE) IMPLANT
SURGIFLO W/THROMBIN 8M KIT (HEMOSTASIS) ×1 IMPLANT
SUT SILK 2 0SH CR/8 30 (SUTURE) ×1 IMPLANT
SUT STRATA 3-0 15 PS-2 (SUTURE) IMPLANT
SUT VIC AB 0 CT1 18XCR BRD 8 (SUTURE) ×2 IMPLANT
SUT VIC AB 2-0 CT1 18 (SUTURE) ×2 IMPLANT
SYR 30ML LL (SYRINGE) ×2 IMPLANT
TRAP FLUID SMOKE EVACUATOR (MISCELLANEOUS) ×1 IMPLANT

## 2024-10-25 NOTE — Interval H&P Note (Signed)
 History and Physical Interval Note:  10/25/2024 8:25 AM  Julie Berger  has presented today for surgery, with the diagnosis of SCS battery at end of life.  The various methods of treatment have been discussed with the patient and family. After consideration of risks, benefits and other options for treatment, the patient has consented to  Procedure(s): SPINAL CORD STIMULATOR BATTERY EXCHANGE AND POCKET REVISION (Left) as a surgical intervention.  The patient's history has been reviewed, patient examined, no change in status, stable for surgery.  I have reviewed the patient's chart and labs.  Questions were answered to the patient's satisfaction.    Heart and lungs clear   Penne LELON Sharps

## 2024-10-25 NOTE — Anesthesia Preprocedure Evaluation (Signed)
 Anesthesia Evaluation  Patient identified by MRN, date of birth, ID band Patient awake    Reviewed: Allergy & Precautions, H&P , NPO status , Patient's Chart, lab work & pertinent test results, reviewed documented beta blocker date and time   History of Anesthesia Complications Negative for: history of anesthetic complications  Airway Mallampati: II  TM Distance: >3 FB Neck ROM: full    Dental  (+) Dental Advidsory Given, Edentulous Upper, Edentulous Lower   Pulmonary neg shortness of breath, neg sleep apnea, neg COPD, neg recent URI, former smoker   Pulmonary exam normal breath sounds clear to auscultation       Cardiovascular Exercise Tolerance: Good (-) hypertension(-) angina (-) Past MI and (-) Cardiac Stents + dysrhythmias Atrial Fibrillation (-) Valvular Problems/Murmurs Rhythm:regular Rate:Normal     Neuro/Psych neg Seizures PSYCHIATRIC DISORDERS  Depression     Neuromuscular disease    GI/Hepatic Neg liver ROS,GERD  ,,  Endo/Other  negative endocrine ROS    Renal/GU negative Renal ROS  negative genitourinary   Musculoskeletal   Abdominal   Peds  Hematology negative hematology ROS (+)   Anesthesia Other Findings Past Medical History: No date: Atrial fibrillation (HCC) No date: Barrett esophagus No date: Battery end of life of spinal cord stimulator No date: Blood transfusion without reported diagnosis No date: Cervical cancer (HCC) No date: Chronic pain No date: Complex regional pain syndrome I No date: Depression No date: Fibromyalgia No date: Former smoker No date: GERD (gastroesophageal reflux disease) No date: History of anemia No date: Other foreign object in esophagus causing other injury,  initial encounter 2011: RSD (reflex sympathetic dystrophy) No date: Spinal cord stimulator status   Reproductive/Obstetrics negative OB ROS                              Anesthesia  Physical Anesthesia Plan  ASA: 2  Anesthesia Plan: General   Post-op Pain Management:    Induction: Intravenous  PONV Risk Score and Plan: 3 and Ondansetron, Dexamethasone, Midazolam and Treatment may vary due to age or medical condition  Airway Management Planned: Oral ETT  Additional Equipment:   Intra-op Plan:   Post-operative Plan: Extubation in OR  Informed Consent: I have reviewed the patients History and Physical, chart, labs and discussed the procedure including the risks, benefits and alternatives for the proposed anesthesia with the patient or authorized representative who has indicated his/her understanding and acceptance.     Dental Advisory Given  Plan Discussed with: Anesthesiologist, CRNA and Surgeon  Anesthesia Plan Comments:         Anesthesia Quick Evaluation

## 2024-10-25 NOTE — Anesthesia Procedure Notes (Signed)
 Procedure Name: Intubation Date/Time: 10/25/2024 9:05 AM  Performed by: Colon Morna SQUIBB, RNPre-anesthesia Checklist: Patient identified, Emergency Drugs available, Suction available, Patient being monitored and Timeout performed Patient Re-evaluated:Patient Re-evaluated prior to induction Oxygen Delivery Method: Circle system utilized and Simple face mask Preoxygenation: Pre-oxygenation with 100% oxygen Induction Type: IV induction Ventilation: Mask ventilation without difficulty and Oral airway inserted - appropriate to patient size Laryngoscope Size: McGrath and 3 Grade View: Grade I Tube type: Oral Tube size: 7.0 mm Number of attempts: 1 Airway Equipment and Method: Stylet and Oral airway Placement Confirmation: ETT inserted through vocal cords under direct vision, positive ETCO2 and breath sounds checked- equal and bilateral Tube secured with: Tape Dental Injury: Teeth and Oropharynx as per pre-operative assessment

## 2024-10-25 NOTE — Op Note (Signed)
 Indications: 58 year old woman with a history of an implanted spinal cord stimulator for chronic pain syndrome.  She has been getting good relief with her system and is at the end of her life for her battery/IPG.  She has been planned for a revision of her left-sided IPG with the revision of her pocket.   Findings: successful placement of a left-sided IPG  Preoperative Diagnosis: SCS battery at end of life  Postoperative Diagnosis: SCS battery at end of life     EBL: Minimal IVF: see anesthesia record Drains: none Disposition: Extubated and Stable to PACU Complications: none   No foley catheter was placed.     Preoperative Note:    Risks of surgery discussed in clinic.   Operative Note:      The patient was then brought from the preoperative center with intravenous access established.  The patient underwent general anesthesia and endotracheal tube intubation, then was rotated on the [facility] table where all pressure points were appropriately padded.  The skin was then thoroughly cleansed.  Perioperative antibiotic prophylaxis was administered.  Sterile prep and drapes were then applied and a timeout was then observed.      The incision on the left flank was then opened and a pocket formed until it was large enough for the pulse generator to be removed.  The leads were examined in the pocket and did not show any evidence of breakdown or physical evidence of wear and tear.  The leads were removed from the battery.  The pocket was copiously irrigated.  We then closed the inferior portion of the pocket as soon as quite large for the new battery type.  We connected the battery to the existing leads and showed good interrogation.  Because of this we were able to final tightened the wires into place and placed them into the pocket which was revised and extended slightly cranially.  This was a good fit for the new battery size.  It was irrigated with Irrisept and then with the final irrigation.   Hemostasis was meticulous.   The wounds were closed in layers with 0 and 2-0 vicryl.  The skin was approximated with monocryl. A sterile dressing was applied.     Patient was then rotated back to the preoperative bed awakened from anesthesia and taken to recovery. All counts are correct in this case.   I performed the entire procedure with the assistance of Edsel Goods PA as an designer, television/film set. An assistant was required for this procedure due to the complexity.  The assistant provided assistance in tissue manipulation and suction, and was required for the successful and safe performance of the procedure. I performed the critical portions of the procedure.  Implant Name Type Inv. Item Serial No. Manufacturer Lot No. LRB No. Used Action  GENERATOR NEUROSTIM ETERNA 16 - [id number] Generator GENERATOR NEUROSTIM ETERNA 16 [id number] [facility]  Left 1 Implanted    Penne MICAEL Sharps, MD

## 2024-10-25 NOTE — Transfer of Care (Signed)
 Immediate Anesthesia Transfer of Care Note  Patient: Julie Berger  Procedure(s) Performed: SPINAL CORD STIMULATOR BATTERY EXCHANGE AND POCKET REVISION (Left)  Patient Location: PACU  Anesthesia Type:General  Level of Consciousness: awake, alert , drowsy, and patient cooperative  Airway & Oxygen Therapy: Patient Spontanous Breathing and Patient connected to face mask oxygen  Post-op Assessment: Report given to RN, Post -op Vital signs reviewed and stable, and Patient moving all extremities X 4  Post vital signs: Reviewed and stable  Last Vitals:  Vitals Value Taken Time  BP 109/91 10/25/24 10:00  Temp 36.5 C 10/25/24 10:00  Pulse 77 10/25/24 10:03  Resp 11 10/25/24 10:03  SpO2 100 % 10/25/24 10:03  Vitals shown include unfiled device data.  Last Pain:  Vitals:   10/25/24 1000  TempSrc:   PainSc: Asleep         Complications: No notable events documented.

## 2024-10-25 NOTE — Discharge Instructions (Addendum)
 NEUROSURGERY DISCHARGE INSTRUCTIONS  Admission diagnosis: Spinal cord stimulator status [Z96.89] Chronic pain syndrome [G89.4] Battery end of life of spinal cord stimulator [Z45.42]  Operative procedure: Spinal cord stimulator battery exchange  What to do after you leave the hospital:  Recommended diet: regular diet. Increase protein intake to promote wound healing.  Recommended activity: activity as tolerated. No driving while taking narcotics. You should walk multiple times per day  Special Instructions  No straining, no heavy lifting > 10lbs x 4 weeks.  Keep incision area clean and dry. May shower in 2 days. No baths or pools for 6 weeks.  Please remove dressing tomorrow, no need to apply a bandage afterwards  You have no sutures to remove, the skin is closed with adhesive  Please take pain medications as directed. Take a stool softener if on pain medications  *Regarding compression stockings-  Please wear day and night until you are walking a couple hundred feet three times a day.   Please Report any of the following: Nausea or Vomiting, Temperature is greater than 101.55F (38.1C) degrees, Dizziness, Abdominal Pain, Difficulty Breathing or Shortness of Breath, Inability to Eat, drink Fluids, or Take medications, Bleeding, swelling, or drainage from surgical incision sites, New numbness or weakness, and Bowel or bladder dysfunction to the neurosurgeon on call. How to contact us :  If you have any questions/concerns before or after surgery, you can reach us  at [phone], or you can send a mychart message. We can be reached by phone or mychart 8am-4pm, Monday-Friday.  *Please note: Calls after 4pm are forwarded to a third party answering service. Mychart messages are not routinely monitored during evenings, weekends, and holidays. Please call our office to contact the answering service for urgent concerns during non-business hours.   Additional Follow up appointments Please follow up  with Lyle Decamp PA-C as scheduled in 2-3 weeks   Please see below for scheduled appointments:  Future Appointments  Date Time Provider Department Center  11/08/2024  3:00 PM Decamp Lyle, PA-C [facility]  12/05/2024  2:45 PM Claudene Penne ORN, MD [facility]

## 2024-10-26 ENCOUNTER — Encounter: Payer: Self-pay | Admitting: Neurosurgery

## 2024-10-26 NOTE — Addendum Note (Signed)
 Addended by: SIMMONS-ROBINSON, Arion Shankles L on: 10/26/2024 07:37 AM   Modules accepted: Orders

## 2024-10-27 NOTE — Anesthesia Postprocedure Evaluation (Signed)
 Anesthesia Post Note  Patient: Marwah Hemrick  Procedure(s) Performed: SPINAL CORD STIMULATOR BATTERY EXCHANGE AND POCKET REVISION (Left)  Patient location during evaluation: PACU Anesthesia Type: General Level of consciousness: awake and alert Pain management: pain level controlled Vital Signs Assessment: post-procedure vital signs reviewed and stable Respiratory status: spontaneous breathing, nonlabored ventilation, respiratory function stable and patient connected to nasal cannula oxygen Cardiovascular status: blood pressure returned to baseline and stable Postop Assessment: no apparent nausea or vomiting Anesthetic complications: no   No notable events documented.   Last Vitals:  Vitals:   10/25/24 1059 10/25/24 1100  BP:  110/80  Pulse: 70   Resp: 14   Temp: (!) 36.2 C   SpO2: 96%     Last Pain:  Vitals:   10/26/24 0924  TempSrc:   PainSc: 0-No pain                 Prentice Murphy

## 2024-11-01 ENCOUNTER — Telehealth: Payer: Self-pay

## 2024-11-01 NOTE — Telephone Encounter (Signed)
 Per patients mychart reply, I will schedule it as soon as I recover from  my recent  surgery. Timing will be in about 5 weeks.  Thanks, [employer].  Reminder letter mailed.  Thanks,  Rosaline CMA

## 2024-11-03 ENCOUNTER — Ambulatory Visit: Admitting: Family Medicine

## 2024-11-08 ENCOUNTER — Ambulatory Visit: Admitting: Physician Assistant

## 2024-11-08 ENCOUNTER — Encounter: Payer: Self-pay | Admitting: Physician Assistant

## 2024-11-08 VITALS — BP 116/78 | Temp 98.2°F | Ht 64.0 in | Wt 194.0 lb

## 2024-11-08 DIAGNOSIS — Z4542 Encounter for adjustment and management of neuropacemaker (brain) (peripheral nerve) (spinal cord): Secondary | ICD-10-CM | POA: Diagnosis not present

## 2024-11-08 NOTE — Progress Notes (Unsigned)
° °  REFERRING PHYSICIAN:  Myrla Jon HERO, Md [address] [place],  [place] [address]  DOS: 10/25/2024, spinal cord stimulator battery exchange and pocket revision  HISTORY OF PRESENT ILLNESS: Julie Berger is approximately 2 weeks status post spinal cord stimulator battery exchange and pocket revision on 10/25/2024. she is doing okay.  At night her pain increases.  She is working with pain management to increase her OxyContin  dose during this postoperative period.  No new weakness, numbness or tingling.     PHYSICAL EXAMINATION:  General: Patient is well developed, well nourished, calm, collected, and in no apparent distress.   NEUROLOGICAL:  General: In no acute distress.   Awake, alert, oriented to person, place, and time.  Pupils equal round and reactive to light.  Facial tone is symmetric.     Strength:            Strength is to baseline with no new deficits. Incision c/d/i   ROS (Neurologic):  Negative except as noted above  IMAGING: No new imaging.  ASSESSMENT/PLAN:  Julie Berger is approximately 2 weeks status post spinal cord stimulator battery exchange and pocket revision on 10/25/2024. she is doing okay.  At night her pain increases.  She is working with pain management to increase her OxyContin  dose during this postoperative period. I have advised the patient to lift up to 10 pounds until 6 weeks after surgery, then increase up to 25 pounds until 12 weeks after surgery.  After 12 weeks post-op, the patient advised to increase activity as tolerated.  Spinal cord stimulator rep was present today to help optimize this for her.  Advised to contact the office if any questions or concerns arise.  Lyle Decamp PA-C Department of neurosurgery

## 2024-11-09 DIAGNOSIS — I4891 Unspecified atrial fibrillation: Secondary | ICD-10-CM | POA: Diagnosis not present

## 2024-11-09 DIAGNOSIS — R110 Nausea: Secondary | ICD-10-CM | POA: Diagnosis not present

## 2024-11-09 DIAGNOSIS — M79674 Pain in right toe(s): Secondary | ICD-10-CM | POA: Diagnosis not present

## 2024-11-09 DIAGNOSIS — F330 Major depressive disorder, recurrent, mild: Secondary | ICD-10-CM | POA: Diagnosis not present

## 2024-11-09 DIAGNOSIS — G90529 Complex regional pain syndrome I of unspecified lower limb: Secondary | ICD-10-CM | POA: Diagnosis not present

## 2024-11-09 DIAGNOSIS — G894 Chronic pain syndrome: Secondary | ICD-10-CM | POA: Diagnosis not present

## 2024-11-09 DIAGNOSIS — M5416 Radiculopathy, lumbar region: Secondary | ICD-10-CM | POA: Diagnosis not present

## 2024-11-09 DIAGNOSIS — M797 Fibromyalgia: Secondary | ICD-10-CM | POA: Diagnosis not present

## 2024-11-09 DIAGNOSIS — Z79891 Long term (current) use of opiate analgesic: Secondary | ICD-10-CM | POA: Diagnosis not present

## 2024-11-09 DIAGNOSIS — M542 Cervicalgia: Secondary | ICD-10-CM | POA: Diagnosis not present

## 2024-11-09 DIAGNOSIS — M25512 Pain in left shoulder: Secondary | ICD-10-CM | POA: Diagnosis not present

## 2024-11-15 ENCOUNTER — Other Ambulatory Visit: Payer: Self-pay

## 2024-11-15 ENCOUNTER — Telehealth: Payer: Self-pay

## 2024-11-15 ENCOUNTER — Telehealth: Payer: Self-pay | Admitting: Cardiovascular Disease

## 2024-11-15 DIAGNOSIS — Z1211 Encounter for screening for malignant neoplasm of colon: Secondary | ICD-10-CM

## 2024-11-15 MED ORDER — NA SULFATE-K SULFATE-MG SULF 17.5-3.13-1.6 GM/177ML PO SOLN: 354.0000 mL | Freq: Once | ORAL | 0 refills | Status: AC

## 2024-11-15 NOTE — Telephone Encounter (Signed)
"  °  Patient Consent for Virtual Visit        Julie Berger has provided verbal consent on 11/15/2024 for a virtual visit (video or telephone).   CONSENT FOR VIRTUAL VISIT FOR:  Julie Berger  By participating in this virtual visit I agree to the following:  I hereby voluntarily request, consent and authorize [facility] and its employed or contracted physicians, physician assistants, nurse practitioners or other licensed health care professionals (the Practitioner), to provide me with telemedicine health care services (the Services) as deemed necessary by the treating Practitioner. I acknowledge and consent to receive the Services by the Practitioner via telemedicine. I understand that the telemedicine visit will involve communicating with the Practitioner through live audiovisual communication technology and the disclosure of certain medical information by electronic transmission. I acknowledge that I have been given the opportunity to request an in-person assessment or other available alternative prior to the telemedicine visit and am voluntarily participating in the telemedicine visit.  I understand that I have the right to withhold or withdraw my consent to the use of telemedicine in the course of my care at any time, without affecting my right to future care or treatment, and that the Practitioner or I may terminate the telemedicine visit at any time. I understand that I have the right to inspect all information obtained and/or recorded in the course of the telemedicine visit and may receive copies of available information for a reasonable fee.  I understand that some of the potential risks of receiving the Services via telemedicine include:  Delay or interruption in medical evaluation due to technological equipment failure or disruption; Information transmitted may not be sufficient (e.g. poor resolution of images) to allow for appropriate medical decision making by the  Practitioner; and/or  In rare instances, security protocols could fail, causing a breach of personal health information.  Furthermore, I acknowledge that it is my responsibility to provide information about my medical history, conditions and care that is complete and accurate to the best of my ability. I acknowledge that Practitioner's advice, recommendations, and/or decision may be based on factors not within their control, such as incomplete or inaccurate data provided by me or distortions of diagnostic images or specimens that may result from electronic transmissions. I understand that the practice of medicine is not an exact science and that Practitioner makes no warranties or guarantees regarding treatment outcomes. I acknowledge that a copy of this consent can be made available to me via my patient portal [facility] MyChart), or I can request a printed copy by calling the office of [facility].    I understand that my insurance will be billed for this visit.   I have read or had this consent read to me. I understand the contents of this consent, which adequately explains the benefits and risks of the Services being provided via telemedicine.  I have been provided ample opportunity to ask questions regarding this consent and the Services and have had my questions answered to my satisfaction. I give my informed consent for the services to be provided through the use of telemedicine in my medical care    "

## 2024-11-15 NOTE — Telephone Encounter (Signed)
"  ° °  Pre-operative Risk Assessment    Patient Name: Julie Berger  DOB: [birth year] MRN: [id number]   Date of last office visit: 09/19/24 Date of next office visit: n/a   Request for Surgical Clearance    Procedure:  colonoscopy   Date of Surgery:  Clearance 02/16/25                                Surgeon:  Dr Jinny Socks Group or Practice Name:  [facility] Phone number:  [phone] Fax number:  [phone]   Type of Clearance Requested:   - Medical    Type of Anesthesia:  General    Additional requests/questions:    SignedRosina Stamps   11/15/2024, 4:25 PM   "

## 2024-11-15 NOTE — Telephone Encounter (Signed)
" ° °  Name: Greenlee Ancheta  DOB: [birth year]  MRN: [id number]  Primary Cardiologist: None   Preoperative team, please contact this patient and set up a phone call appointment for further preoperative risk assessment. Please obtain consent and complete medication review. Thank you for your help.  I confirm that guidance regarding antiplatelet and oral anticoagulation therapy has been completed and, if necessary, noted below.  None requested.  I also confirmed the patient resides in the state of [place] . As per [facility] Medical Board telemedicine laws, the patient must reside in the state in which the provider is licensed.  Miriam Shams, FNP-C  11/15/2024, 4:50 PM [facility] [address] Office [phone] Fax [phone]    "

## 2024-11-15 NOTE — Telephone Encounter (Signed)
 Gastroenterology Pre-Procedure Review  Request Date: 02/16/2025 Requesting Physician: Dr. Jinny  PATIENT REVIEW QUESTIONS: The patient responded to the following health history questions as indicated:    1. Are you having any GI issues? no 2. Do you have a personal history of Polyps? no 3. Do you have a family history of Colon Cancer or Polyps? no 4. Diabetes Mellitus? no 5. Joint replacements in the past 12 months?no 6. Major health problems in the past 3 months?yes (bronchitis) 7. Any artificial heart valves, MVP, or defibrillator?no    MEDICATIONS & ALLERGIES:    Patient reports the following regarding taking any anticoagulation/antiplatelet therapy:   Plavix, Coumadin, Eliquis, Xarelto, Lovenox, Pradaxa, Brilinta, or Effient? no Aspirin? no  Patient confirms/reports the following medications:  Current Outpatient Medications  Medication Sig Dispense Refill   buPROPion  450 MG TB24 Take 1 tablet (450 mg total) by mouth daily. (Patient taking differently: Take 450 mg by mouth every morning.) 90 tablet 1   diltiazem  (CARDIZEM  CD) 120 MG 24 hr capsule Take 1 capsule (120 mg total) by mouth daily. (Patient taking differently: Take 120 mg by mouth at bedtime.) 90 capsule 3   diltiazem  (CARDIZEM ) 60 MG tablet Take 1 tablet (60 mg total) by mouth 3 (three) times daily as needed (for tachycardia). 60 tablet 2   OXYCONTIN  10 MG 12 hr tablet Take 10 mg by mouth every 12 (twelve) hours.     pregabalin  (LYRICA ) 150 MG capsule Take 1 capsule (150 mg total) by mouth 3 (three) times daily. 90 capsule 1   senna (SENOKOT) 8.6 MG TABS tablet Take 1 tablet (8.6 mg total) by mouth 2 (two) times daily as needed for mild constipation. 30 tablet 0   No current facility-administered medications for this visit.    Patient confirms/reports the following allergies:  Allergies[1]  No orders of the defined types were placed in this encounter.   AUTHORIZATION INFORMATION Primary Insurance: 1D#: Group  #:  Secondary Insurance: 1D#: Group #:  SCHEDULE INFORMATION: Date: 02/16/2025 Time: Location: [facility] Dr. Jinny     [1]  Allergies Allergen Reactions   Nsaids Shortness Of Breath

## 2024-11-15 NOTE — Telephone Encounter (Signed)
 Cardiac clearance was faxed to Dr. Evalene Pauling office. Awaiting on response.

## 2024-11-15 NOTE — Telephone Encounter (Signed)
 Preop tele appt now scheduled, med rec and consent done.

## 2024-11-16 ENCOUNTER — Telehealth: Payer: Self-pay | Admitting: Cardiovascular Disease

## 2024-11-16 NOTE — Telephone Encounter (Signed)
 Will discard.  Duplicate request.  Julie Berger. Brendt Dible NP-C     11/16/2024, 10:38 AM [facility] [address] [place], [place] [address] Office [phone]

## 2024-11-16 NOTE — Telephone Encounter (Signed)
"  ° °  Pre-operative Risk Assessment    Patient Name: Julie Berger  DOB: 05/12/1966 MRN: [id number]   Date of last office visit: unknown Date of next office visit: unknown   Request for Surgical Clearance    Procedure:  colonscopy  Date of Surgery:  Clearance 02/16/25                                Surgeon:  Dr. Jinny Socks Group or Practice Name:  [facility] Phone number:  [phone] Fax number:  [phone]   Type of Clearance Requested:   - Medical    Type of Anesthesia:  General    Additional requests/questions:    SignedBerwyn LELON Sprung   11/16/2024, 9:53 AM   "

## 2024-12-05 ENCOUNTER — Ambulatory Visit: Admitting: Neurosurgery

## 2024-12-05 VITALS — BP 132/84 | Temp 99.2°F | Ht 64.0 in | Wt 189.0 lb

## 2024-12-05 DIAGNOSIS — Z4542 Encounter for adjustment and management of neuropacemaker (brain) (peripheral nerve) (spinal cord): Secondary | ICD-10-CM

## 2024-12-05 NOTE — Progress Notes (Signed)
" ° °  REFERRING PHYSICIAN:  Myrla Jon HERO, Md [address] [place],  [place] [address]  DOS: 10/25/2024, spinal cord stimulator battery exchange and pocket revision  HISTORY OF PRESENT ILLNESS: Shuntell Peterkin is approximately 6 weeks status post spinal cord stimulator battery exchange and pocket revision on 10/25/2024.  She is very happy as she is getting significant relief from the stimulation settings now.  PHYSICAL EXAMINATION:  General: Patient is well developed, well nourished, calm, collected, and in no apparent distress.   NEUROLOGICAL:  General: In no acute distress.   Awake, alert, oriented to person, place, and time.  Pupils equal round and reactive to light.  Facial tone is symmetric.     Strength:            Strength is to baseline with no new deficits. Incision c/d/i   ROS (Neurologic):  Negative except as noted above  IMAGING: No new imaging.  ASSESSMENT/PLAN:  Addylin Olson is approximately 6 weeks status post spinal cord stimulator battery exchange and pocket revision on 10/25/2024.  Overall she is doing very well.  She states that her pain is much better controlled now that she is getting stimulation.  She feels optimistic about her results and looks forward to continued therapy.   Penne MICAEL Sharps, MD  Department of neurosurgery    "

## 2024-12-13 NOTE — Telephone Encounter (Signed)
 01/31/2025 is when patient sees her cardiologist: Dr. Gollan. Procedure date: 02/16/2025 With Dr. Jinny

## 2025-01-31 ENCOUNTER — Ambulatory Visit

## 2025-02-16 ENCOUNTER — Ambulatory Visit: Admit: 2025-02-16 | Admitting: Gastroenterology

## 2025-02-16 SURGERY — COLONOSCOPY
Anesthesia: General
